# Patient Record
Sex: Female | Born: 1990 | Hispanic: No | Marital: Married | Smoking: Never smoker
Health system: Southern US, Community
[De-identification: ages and names within clinical notes are randomized; demographics above are authoritative.]

## PROBLEM LIST (undated history)

## (undated) DIAGNOSIS — J329 Chronic sinusitis, unspecified: Secondary | ICD-10-CM

## (undated) DIAGNOSIS — J358 Other chronic diseases of tonsils and adenoids: Secondary | ICD-10-CM

## (undated) DIAGNOSIS — N83209 Unspecified ovarian cyst, unspecified side: Secondary | ICD-10-CM

## (undated) DIAGNOSIS — Z8619 Personal history of other infectious and parasitic diseases: Secondary | ICD-10-CM

## (undated) DIAGNOSIS — T148XXA Other injury of unspecified body region, initial encounter: Secondary | ICD-10-CM

## (undated) HISTORY — DX: Unspecified ovarian cyst, unspecified side: N83.209

## (undated) HISTORY — DX: Other chronic diseases of tonsils and adenoids: J35.8

## (undated) HISTORY — DX: Chronic sinusitis, unspecified: J32.9

## (undated) HISTORY — DX: Other injury of unspecified body region, initial encounter: T14.8XXA

## (undated) HISTORY — DX: Personal history of other infectious and parasitic diseases: Z86.19

## (undated) HISTORY — PX: MOLE REMOVAL: SHX2046

## (undated) HISTORY — PX: WISDOM TOOTH EXTRACTION: SHX21

---
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
## 2011-06-01 DIAGNOSIS — T148XXA Other injury of unspecified body region, initial encounter: Secondary | ICD-10-CM

## 2011-06-01 HISTORY — DX: Other injury of unspecified body region, initial encounter: T14.8XXA

## 2011-10-13 ENCOUNTER — Encounter: Payer: Self-pay | Admitting: Women's Health

## 2011-10-27 ENCOUNTER — Encounter: Payer: Self-pay | Admitting: Women's Health

## 2011-12-26 ENCOUNTER — Emergency Department: Payer: Self-pay | Admitting: Emergency Medicine

## 2011-12-26 DIAGNOSIS — N83209 Unspecified ovarian cyst, unspecified side: Secondary | ICD-10-CM

## 2011-12-26 DIAGNOSIS — J329 Chronic sinusitis, unspecified: Secondary | ICD-10-CM

## 2011-12-26 HISTORY — DX: Unspecified ovarian cyst, unspecified side: N83.209

## 2011-12-26 HISTORY — DX: Chronic sinusitis, unspecified: J32.9

## 2011-12-26 LAB — BASIC METABOLIC PANEL
Anion Gap: 10 (ref 7–16)
BUN: 7 mg/dL (ref 7–18)
Calcium, Total: 9.1 mg/dL (ref 9.0–10.7)
Chloride: 104 mmol/L (ref 98–107)
Co2: 28 mmol/L (ref 21–32)
Creatinine: 0.7 mg/dL (ref 0.60–1.30)
EGFR (African American): >60
EGFR (Non-African Amer.): >60
Glucose: 91 mg/dL (ref 65–99)
Osmolality: 281 (ref 275–301)
Potassium: 3.7 mmol/L (ref 3.5–5.1)
Sodium: 142 mmol/L (ref 136–145)

## 2011-12-26 LAB — HCG, QUANTITATIVE, PREGNANCY: Beta Hcg, Quant.: <1 m[IU]/mL — ABNORMAL LOW

## 2011-12-26 LAB — WET PREP, GENITAL

## 2011-12-31 ENCOUNTER — Ambulatory Visit (INDEPENDENT_AMBULATORY_CARE_PROVIDER_SITE_OTHER): Payer: 59 | Admitting: Women's Health

## 2011-12-31 ENCOUNTER — Encounter: Payer: Self-pay | Admitting: Women's Health

## 2011-12-31 VITALS — BP 118/72 | Ht 64.5 in | Wt 186.0 lb

## 2011-12-31 DIAGNOSIS — IMO0001 Reserved for inherently not codable concepts without codable children: Secondary | ICD-10-CM

## 2011-12-31 DIAGNOSIS — E079 Disorder of thyroid, unspecified: Secondary | ICD-10-CM

## 2011-12-31 DIAGNOSIS — Z309 Encounter for contraceptive management, unspecified: Secondary | ICD-10-CM

## 2011-12-31 DIAGNOSIS — N83202 Unspecified ovarian cyst, left side: Secondary | ICD-10-CM

## 2011-12-31 DIAGNOSIS — Z01419 Encounter for gynecological examination (general) (routine) without abnormal findings: Secondary | ICD-10-CM

## 2011-12-31 DIAGNOSIS — N83209 Unspecified ovarian cyst, unspecified side: Secondary | ICD-10-CM

## 2011-12-31 MED ORDER — NORGESTIM-ETH ESTRAD TRIPHASIC 0.18/0.215/0.25 MG-35 MCG PO TABS: 1.0000 | ORAL_TABLET | Freq: Every day | ORAL | Status: DC

## 2011-12-31 NOTE — Progress Notes (Signed)
Michele Burnett [birth year] [id number]    History:    The patient presents for annual exam.  Monthly 5 days cycle using condoms. Had been on Ortho Tri-Cyclen without a problem stopped about 4 months ago due to a change in her insurance. Was seen at her primary care on January 25 due to abdominal pain. Had a CT scan ruled out appendicitis but did show a left ovarian cyst. Had a negative STD screen. Was treated for BV. Complete gardasil series in 09.  Past medical history, past surgical history, family history and social history were all reviewed and documented in the EPIC chart.   ROS:  A  ROS was performed and pertinent positives and negatives are included in the history.  Exam:  Filed Vitals:   12/31/11 0934  BP: 118/72    General appearance:  Normal Head/Neck:  Normal, without cervical or supraclavicular adenopathy. Thyroid:  Symmetrical, normal in size, without palpable masses or nodularity. Respiratory  Effort:  Normal  Auscultation:  Clear without wheezing or rhonchi Cardiovascular  Auscultation:  Regular rate, without rubs, murmurs or gallops  Edema/varicosities:  Not grossly evident Abdominal  Soft,nontender, without masses, guarding or rebound.  Liver/spleen:  No organomegaly noted  Hernia:  None appreciated  Skin  Inspection:  Grossly normal  Palpation:  Grossly normal Neurologic/psychiatric  Orientation:  Normal with appropriate conversation.  Mood/affect:  Normal  Genitourinary    Breasts: Examined lying and sitting.     Right: Without masses, retractions, discharge or axillary adenopathy.     Left: Without masses, retractions, discharge or axillary adenopathy.   Inguinal/mons:  Normal without inguinal adenopathy  External genitalia:  Normal  BUS/Urethra/Skene's glands:  Normal  Bladder:  Normal  Vagina:  Normal  Cervix:  Normal  Uterus:  normal in size, shape and contour.  Midline and mobile  Adnexa/parametria:     Rt: Without masses or  tenderness.   Lt: Without masses or tenderness.  Anus and perineum: Normal  Digital rectal exam:   Assessment/Plan:  21 y.o. SWF G0 for annual exam.  Ruptured left ovarian cyst 12/26/11 Contraception counseling  30 pound weight gain in twenty years  Plan: TSH, declined need for HIV, hepatitis, RPR. Contraception reviewed. Ortho Tri-Cyclen prescription, proper use, start up instructions, condoms first month and for infection control encouraged. Return to office after March cycle we'll repeat ultrasound to check resolution of cyst. SBEs, exercise, calcium rich diet , MVI daily encouraged. Reviewed importance of healthier lifestyle for weight loss and weight maintenance. Dating and driving safety reviewed.    Harrington Challenger [facility], 10:21 AM 12/31/2011

## 2012-01-08 ENCOUNTER — Encounter: Payer: Self-pay | Admitting: Women's Health

## 2012-02-04 ENCOUNTER — Telehealth: Payer: Self-pay | Admitting: *Deleted

## 2012-02-04 MED ORDER — IBUPROFEN 600 MG PO TABS: 600.0000 mg | ORAL_TABLET | Freq: Three times a day (TID) | ORAL | Status: AC | PRN

## 2012-02-04 NOTE — Telephone Encounter (Signed)
(  follow up 12/31/11 OV) Pt mother Michele Burnett called because Michele Burnett was in school. She said that pt is having pelvic pain like before, cramping. I asked mother which side the pain was on and she was unsure. Michele Burnett asked if pt could have something to relieve pain? Please advise

## 2012-02-04 NOTE — Telephone Encounter (Signed)
Please call in Motrin [phone] every 8 hours prn #[phone] no refills. Have her call if no relief.

## 2012-02-04 NOTE — Telephone Encounter (Signed)
Pt mother informed with the below note. rx sent to pharmacy.

## 2012-02-12 ENCOUNTER — Ambulatory Visit (INDEPENDENT_AMBULATORY_CARE_PROVIDER_SITE_OTHER): Payer: 59 | Admitting: Women's Health

## 2012-02-12 ENCOUNTER — Encounter: Payer: Self-pay | Admitting: Women's Health

## 2012-02-12 DIAGNOSIS — N946 Dysmenorrhea, unspecified: Secondary | ICD-10-CM

## 2012-02-12 NOTE — Progress Notes (Signed)
Patient ID: Michele Burnett, female   DOB: Mar 05, 1991, 21 y.o.   MRN: [id number] Presents with the complaint of passing unusual clots with menses. Cycle was at the normal time on Ortho Tri-Cyclen. Same partner for one year with negative cultures. Denies any bleeding between cycles. States abdominal pain is less than it had been. History of an ovarian cyst.  Exam: Abdomen soft with no rebound or radiation of pain. External genitalia within normal limits, speculum exam scant menses type blood with no clots noted. Bimanual uterus was small with no adnexal fullness or tenderness.  Dysmenorrhea  Plan: Reassured normality of occasional blood clots with cycle. Motrin when necessary with cycle, continuing Ortho Tri-Cyclen daily. Patient brought blood clot in ziplock bag, reviewed it appeared to be a normal menses type blood clot.

## 2012-08-05 ENCOUNTER — Telehealth: Payer: Self-pay | Admitting: *Deleted

## 2012-08-05 MED ORDER — NORGESTIM-ETH ESTRAD TRIPHASIC 0.18/0.215/0.25 MG-35 MCG PO TABS: 1.0000 | ORAL_TABLET | Freq: Every day | ORAL | Status: DC

## 2012-08-05 NOTE — Telephone Encounter (Signed)
Pt called requesting refill on birth control, pt has new pharmacy. rx sent.

## 2012-12-22 ENCOUNTER — Emergency Department: Payer: Self-pay | Admitting: Emergency Medicine

## 2013-01-03 ENCOUNTER — Ambulatory Visit (INDEPENDENT_AMBULATORY_CARE_PROVIDER_SITE_OTHER): Payer: 59 | Admitting: Women's Health

## 2013-01-03 ENCOUNTER — Encounter: Payer: Self-pay | Admitting: Women's Health

## 2013-01-03 VITALS — BP 118/76 | Ht 64.0 in | Wt 190.0 lb

## 2013-01-03 DIAGNOSIS — Z01419 Encounter for gynecological examination (general) (routine) without abnormal findings: Secondary | ICD-10-CM

## 2013-01-03 DIAGNOSIS — Z309 Encounter for contraceptive management, unspecified: Secondary | ICD-10-CM

## 2013-01-03 DIAGNOSIS — A64 Unspecified sexually transmitted disease: Secondary | ICD-10-CM

## 2013-01-03 DIAGNOSIS — IMO0001 Reserved for inherently not codable concepts without codable children: Secondary | ICD-10-CM

## 2013-01-03 LAB — CBC WITH DIFFERENTIAL/PLATELET
Hemoglobin: 12.4 g/dL (ref 12.0–15.0)
Lymphocytes Relative: 27 % (ref 12–46)
Lymphs Abs: 2.6 10*3/uL (ref 0.7–4.0)
MCH: 27.9 pg (ref 26.0–34.0)
Monocytes Relative: 6 % (ref 3–12)
Neutro Abs: 6.2 10*3/uL (ref 1.7–7.7)
Neutrophils Relative %: 64 % (ref 43–77)
Platelets: 360 10*3/uL (ref 150–400)
RBC: 4.44 MIL/uL (ref 3.87–5.11)
WBC: 9.7 10*3/uL (ref 4.0–10.5)

## 2013-01-03 MED ORDER — NORGESTIM-ETH ESTRAD TRIPHASIC 0.18/0.215/0.25 MG-35 MCG PO TABS: 1.0000 | ORAL_TABLET | Freq: Every day | ORAL | Status: DC

## 2013-01-03 NOTE — Progress Notes (Signed)
Michele Burnett 1991-06-12 [id number]    History:    The patient presents for annual exam.  Regular monthly cycle on Ortho Tri-Cyclen without complaint. Gardasil series completed 2009. History of Chlamydia in 2011. Same partner for 2 years. 30 pound weight gain in 5 years relates to diet and exercise.  Past medical history, past surgical history, family history and social history were all reviewed and documented in the EPIC chart. 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   ROS:  A  ROS was performed and pertinent positives and negatives are included in the history.  Exam:  Filed Vitals:   01/03/13 1110  BP: 118/76    General appearance:  Normal Head/Neck:  Normal, without cervical or supraclavicular adenopathy. Thyroid:  Symmetrical, normal in size, without palpable masses or nodularity. Respiratory  Effort:  Normal  Auscultation:  Clear without wheezing or rhonchi Cardiovascular  Auscultation:  Regular rate, without rubs, murmurs or gallops  Edema/varicosities:  Not grossly evident Abdominal  Soft,nontender, without masses, guarding or rebound.  Liver/spleen:  No organomegaly noted  Hernia:  None appreciated  Skin  Inspection:  Grossly normal  Palpation:  Grossly normal Neurologic/psychiatric  Orientation:  Normal with appropriate conversation.  Mood/affect:  Normal  Genitourinary    Breasts: Examined lying and sitting.     Right: Without masses, retractions, discharge or axillary adenopathy.     Left: Without masses, retractions, discharge or axillary adenopathy.   Inguinal/mons:  Normal without inguinal adenopathy  External genitalia:  Normal  BUS/Urethra/Skene's glands:  Normal  Bladder:  Normal  Vagina:  Normal  Cervix:  Normal  Uterus:  normal in size, shape and contour.  Midline and mobile  Adnexa/parametria:     Rt: Without masses or tenderness.   Lt: Without masses or tenderness.  Anus and perineum: Normal   Assessment/Plan:  22  y.o. S. WF G0 for annual exam with no complaints.  Normal GYN exam on Ortho Tri-Cyclen Obesity  Plan: Reviewed importance of decreasing calories and increasing exercise for weight loss for health. SBE's, MVI daily, calcium rich diet, Weight Watchers diet encouraged. CBC, UA, Pap screening guidelines reviewed.     Harrington Challenger [facility], 11:36 AM 01/03/2013

## 2013-01-03 NOTE — Patient Instructions (Addendum)
Health Maintenance, 18- to 21-Year-Old SCHOOL PERFORMANCE After high school completion, the Michele Burnett adult may be attending college, technical or vocational school, or entering the military or the work force. SOCIAL AND EMOTIONAL DEVELOPMENT The Michele Burnett adult establishes adult relationships and explores sexual identity. Michele Burnett adults may be living at home or in a college dorm or apartment. Increasing independence is important with Michele Burnett adults. Throughout adolescence, teens should assume responsibility of their own health care. IMMUNIZATIONS Most Michele Burnett adults should be fully vaccinated. A booster dose of Tdap (tetanus, diphtheria, and pertussis, or "whooping cough"), a dose of meningococcal vaccine to protect against a certain type of bacterial meningitis, hepatitis A, human papillomarvirus (HPV), chickenpox, or measles vaccines may be indicated, if not given at an earlier age. Annual influenza or "flu" vaccination should be considered during flu season.   TESTING Annual screening for vision and hearing problems is recommended. Vision should be screened objectively at least once between 18 and 21 years of age. The Michele Burnett adult may be screened for anemia or tuberculosis. Michele Burnett adults should have a blood test to check for high cholesterol during this time period. Michele Burnett adults should be screened for use of alcohol and drugs. If the Michele Burnett adult is sexually active, screening for sexually transmitted infections, pregnancy, or HIV may be performed. Screening for cervical cancer should be performed within 3 years of beginning sexual activity. NUTRITION AND ORAL HEALTH  Adequate calcium intake is important. Consume 3 servings of low-fat milk and dairy products daily. For those who do not drink milk or consume dairy products, calcium enriched foods, such as juice, bread, or cereal, dark, leafy greens, or canned fish are alternate sources of calcium.   Drink plenty of water. Limit fruit juice to 8 to 12 ounces per day.  Avoid sugary beverages or sodas.   Discourage skipping meals, especially breakfast. Teens should eat a good variety of vegetables and fruits, as well as lean meats.   Avoid high fat, high salt, and high sugar foods, such as candy, chips, and cookies.   Encourage Michele Burnett adults to participate in meal planning and preparation.   Eat meals together as a family whenever possible. Encourage conversation at mealtime.   Limit fast food choices and eating out at restaurants.   Brush teeth twice a day and floss.   Schedule dental exams twice a year.  SLEEP Regular sleep habits are important. PHYSICAL, SOCIAL, AND EMOTIONAL DEVELOPMENT  One hour of regular physical activity daily is recommended. Continue to participate in sports.   Encourage Michele Burnett adults to develop their own interests and consider community service or volunteerism.   Provide guidance to the Michele Burnett adult in making decisions about college and work plans.   Make sure that Michele Burnett adults know that they should never be in a situation that makes them uncomfortable, and they should tell partners if they do not want to engage in sexual activity.   Talk to the Michele Burnett adult about body image. Eating disorders may be noted at this time. Michele Burnett adults may also be concerned about being overweight. Monitor the Michele Burnett adult for weight gain or loss.   Mood disturbances, depression, anxiety, alcoholism, or attention problems may be noted in Michele Burnett adults. Talk to the caregiver if there are concerns about mental illness.   Negotiate limit setting and independent decision making.   Encourage the Michele Burnett adult to handle conflict without physical violence.   Avoid loud noises which may impair hearing.   Limit television and computer time to 2 hours per   day. Individuals who engage in excessive sedentary activity are more likely to become overweight.  RISK BEHAVIORS  Sexually active Michele Burnett adults need to take precautions against pregnancy and sexually  transmitted infections. Talk to Michele Burnett adults about contraception.   Provide a tobacco-free and drug-free environment for the Michele Burnett adult. Talk to the Michele Burnett adult about drug, tobacco, and alcohol use among friends or at friends' homes. Make sure the Michele Burnett adult knows that smoking tobacco or marijuana and taking drugs have health consequences and may impact brain development.   Teach the Michele Burnett adult about appropriate use of over-the-counter or prescription medicines.   Establish guidelines for driving and for riding with friends.   Talk to Michele Burnett adults about the risks of drinking and driving or boating. Encourage the Cheria Burnett adult to call you if he or she or friends have been drinking or using drugs.   Remind Michele Burnett adults to wear seat belts at all times in cars and life vests in boats.   Michele Burnett adults should always wear a properly fitted helmet when they are riding a bicycle.   Use caution with all-terrain vehicles (ATVs) or other motorized vehicles.   Do not keep handguns in the home. (If you do, the gun and ammunition should be locked separately and out of the Michele Burnett adult's access.)   Equip your home with smoke detectors and change the batteries regularly. Make sure all family members know the fire escape plans for your home.   Teach Michele Burnett adults not to swim alone and not to dive in shallow water.   All individuals should wear sunscreen that protects against UVA and UVB light with at least a sun protection factor (SPF) of 30 when out in the sun. This minimizes sun burning.  WHAT'S NEXT? Michele Burnett adults should visit their pediatrician or family physician yearly. By Michele Burnett adulthood, health care should be transitioned to a family physician or internal medicine specialist. Sexually active females may want to begin annual physical exams with a gynecologist. Document Released: 02/12/2007 Document Revised: 02/09/2012 Document Reviewed: 03/04/2007 Exit[employer] Patient Information 2013 [employer].    

## 2013-01-04 LAB — URINALYSIS W MICROSCOPIC + REFLEX CULTURE
Casts: NONE SEEN
Glucose, UA: NEGATIVE mg/dL
Hgb urine dipstick: NEGATIVE
Leukocytes, UA: NEGATIVE
Nitrite: NEGATIVE
Specific Gravity, Urine: 1.014 (ref 1.005–1.030)
pH: 8.5 — ABNORMAL HIGH (ref 5.0–8.0)

## 2013-01-06 LAB — URINE CULTURE: Colony Count: >=100000

## 2013-01-07 ENCOUNTER — Other Ambulatory Visit: Payer: Self-pay | Admitting: Women's Health

## 2013-01-07 MED ORDER — SULFAMETHOXAZOLE-TRIMETHOPRIM 800-160 MG PO TABS: 1.0000 | ORAL_TABLET | Freq: Two times a day (BID) | ORAL | Status: DC

## 2013-01-15 ENCOUNTER — Other Ambulatory Visit: Payer: Self-pay

## 2013-06-07 ENCOUNTER — Ambulatory Visit: Payer: Self-pay | Admitting: Gynecology

## 2013-10-06 ENCOUNTER — Other Ambulatory Visit: Payer: Self-pay

## 2013-11-17 ENCOUNTER — Encounter: Payer: Self-pay | Admitting: General Practice

## 2013-12-01 ENCOUNTER — Encounter: Payer: Self-pay | Admitting: General Practice

## 2014-01-04 ENCOUNTER — Ambulatory Visit (INDEPENDENT_AMBULATORY_CARE_PROVIDER_SITE_OTHER): Payer: 59 | Admitting: Women's Health

## 2014-01-04 ENCOUNTER — Other Ambulatory Visit (HOSPITAL_COMMUNITY)
Admission: RE | Admit: 2014-01-04 | Discharge: 2014-01-04 | Disposition: A | Discharge status: Home / Self Care | Payer: 59 | Source: Ambulatory Visit | Attending: Gynecology | Admitting: Gynecology

## 2014-01-04 ENCOUNTER — Encounter: Payer: Self-pay | Admitting: Women's Health

## 2014-01-04 VITALS — BP 111/75 | Ht 64.0 in | Wt 197.6 lb

## 2014-01-04 DIAGNOSIS — Z833 Family history of diabetes mellitus: Secondary | ICD-10-CM

## 2014-01-04 DIAGNOSIS — A499 Bacterial infection, unspecified: Secondary | ICD-10-CM

## 2014-01-04 DIAGNOSIS — B9689 Other specified bacterial agents as the cause of diseases classified elsewhere: Secondary | ICD-10-CM

## 2014-01-04 DIAGNOSIS — E079 Disorder of thyroid, unspecified: Secondary | ICD-10-CM

## 2014-01-04 DIAGNOSIS — IMO0001 Reserved for inherently not codable concepts without codable children: Secondary | ICD-10-CM

## 2014-01-04 DIAGNOSIS — Z309 Encounter for contraceptive management, unspecified: Secondary | ICD-10-CM

## 2014-01-04 DIAGNOSIS — N760 Acute vaginitis: Secondary | ICD-10-CM

## 2014-01-04 DIAGNOSIS — Z01419 Encounter for gynecological examination (general) (routine) without abnormal findings: Secondary | ICD-10-CM | POA: Insufficient documentation

## 2014-01-04 LAB — CBC WITH DIFFERENTIAL/PLATELET
BASOS ABS: 0 10*3/uL (ref 0.0–0.1)
BASOS PCT: 0 % (ref 0–1)
EOS PCT: 2 % (ref 0–5)
Eosinophils Absolute: 0.1 10*3/uL (ref 0.0–0.7)
HCT: 38 % (ref 36.0–46.0)
Hemoglobin: 13 g/dL (ref 12.0–15.0)
LYMPHS PCT: 35 % (ref 12–46)
Lymphs Abs: 3.1 10*3/uL (ref 0.7–4.0)
MCH: 28.8 pg (ref 26.0–34.0)
MCHC: 34.2 g/dL (ref 30.0–36.0)
MCV: 84.1 fL (ref 78.0–100.0)
Monocytes Absolute: 0.6 10*3/uL (ref 0.1–1.0)
Monocytes Relative: 7 % (ref 3–12)
Neutro Abs: 5 10*3/uL (ref 1.7–7.7)
Neutrophils Relative %: 56 % (ref 43–77)
PLATELETS: 305 10*3/uL (ref 150–400)
RBC: 4.52 MIL/uL (ref 3.87–5.11)
RDW: 12.8 % (ref 11.5–15.5)
WBC: 8.8 10*3/uL (ref 4.0–10.5)

## 2014-01-04 LAB — TSH: TSH: 1.765 u[IU]/mL (ref 0.350–4.500)

## 2014-01-04 LAB — WET PREP FOR TRICH, YEAST, CLUE
Trich, Wet Prep: NONE SEEN
Yeast Wet Prep HPF POC: NONE SEEN

## 2014-01-04 LAB — GLUCOSE, RANDOM: Glucose, Bld: 79 mg/dL (ref 70–99)

## 2014-01-04 MED ORDER — NORGESTIM-ETH ESTRAD TRIPHASIC 0.18/0.215/0.25 MG-35 MCG PO TABS: 1.0000 | ORAL_TABLET | Freq: Every day | ORAL | Status: DC

## 2014-01-04 MED ORDER — METRONIDAZOLE 0.75 % VA GEL: VAGINAL | Status: DC

## 2014-01-04 NOTE — Patient Instructions (Signed)
Health Maintenance, 44- to 22-Year-Old SCHOOL PERFORMANCE After high school completion, the Delvina Mizzell adult may be attending college, Hotel manager or vocational school, or entering the [employer] or the work force. SOCIAL AND EMOTIONAL DEVELOPMENT The Yuritzi Kamp adult establishes adult relationships and explores sexual identity. Aigner Horseman adults may be living at home or in a college dorm or apartment. Increasing independence is important with Kanyla Omeara adults. Throughout these years, Jasim Harari adults should assume responsibility of their own health care. RECOMMENDED IMMUNIZATIONS  Influenza vaccine.  All adults should be immunized every year.  All adults, including pregnant women and people with hives-only allergy to eggs can receive the inactivated influenza (IIV) vaccine.  Adults aged 22 49 years can receive the recombinant influenza (RIV) vaccine. The RIV vaccine does not contain any egg protein.  Tetanus, diphtheria, and acellular pertussis (Td, Tdap) vaccine.  Pregnant women should receive 1 dose of Tdap vaccine during each pregnancy. The dose should be obtained regardless of the length of time since the last dose. Immunization is preferred during the 27th to 36th week of gestation.  An adult who has not previously received Tdap or who does not know his or her vaccine status should receive 1 dose of Tdap. This initial dose should be followed by tetanus and diphtheria toxoids (Td) booster doses every 10 years.  Adults with an unknown or incomplete history of completing a 3-dose immunization series with Td-containing vaccines should begin or complete a primary immunization series including a Tdap dose.  Adults should receive a Td booster every 10 years.  Varicella vaccine.  An adult without evidence of immunity to varicella should receive 2 doses or a second dose if he or she has previously received 1 dose.  Pregnant females who do not have evidence of immunity should receive the first dose after pregnancy.  This first dose should be obtained before leaving the health care facility. The second dose should be obtained 4 8 weeks after the first dose.  Human papillomavirus (HPV) vaccine.  Females aged 15 22 years who have not received the vaccine previously should obtain the 3-dose series.  The vaccine is not recommended for use in pregnant females. However, pregnancy testing is not needed before receiving a dose. If a female is found to be pregnant after receiving a dose, no treatment is needed. In that case, the remaining doses should be delayed until after the pregnancy.  Males aged 12 22 years who have not received the vaccine previously should receive the 3-dose series. Males aged 39 22 years may be immunized.  Immunization is recommended through the age of 1 years for any female who has sex with males and did not get any or all doses earlier.  Immunization is recommended for any person with an immunocompromised condition through the age of 27 years if he or she did not get any or all doses earlier.  During the 3-dose series, the second dose should be obtained 4 8 weeks after the first dose. The third dose should be obtained 24 weeks after the first dose and 16 weeks after the second dose.  Measles, mumps, and rubella (MMR) vaccine.  Adults born in [birth year] or later should have 1 or more doses of MMR vaccine unless there is a contraindication to the vaccine or there is laboratory evidence of immunity to each of the three diseases.  A routine second dose of MMR vaccine should be obtained at least 28 days after the first dose for students attending postsecondary schools, health care workers, or international travelers.  For females of childbearing age, rubella immunity should be determined. If there is no evidence of immunity, females who are not pregnant should be vaccinated. If there is no evidence of immunity, females who are pregnant should delay immunization until after pregnancy.  Pneumococcal  13-valent conjugate (PCV13) vaccine.  When indicated, a person who is uncertain of his or her immunization history and has no record of immunization should receive the PCV13 vaccine.  An adult aged 22 years or older who has certain medical conditions and has not been previously immunized should receive 1 dose of PCV13 vaccine. This PCV13 should be followed with a dose of pneumococcal polysaccharide (PPSV23) vaccine. The PPSV23 vaccine dose should be obtained at least 8 weeks after the dose of PCV13 vaccine.  An adult aged 22 years or older who has certain medical conditions and previously received 1 or more doses of PPSV23 vaccine should receive 1 dose of PCV13. The PCV13 vaccine dose should be obtained 1 or more years after the last PPSV23 vaccine dose.  Pneumococcal polysaccharide (PPSV23) vaccine.  When PCV13 is also indicated, PCV13 should be obtained first.  An adult younger than age 65 years who has certain medical conditions should be immunized.  Any person who resides in a nursing home or long-term care facility should be immunized.  An adult smoker should be immunized.  People with an immunocompromised condition and certain other conditions should receive both PCV13 and PPSV23 vaccines.  People with human immunodeficiency virus (HIV) infection should be immunized as soon as possible after diagnosis.  Immunization during chemotherapy or radiation therapy should be avoided.  Routine use of PPSV23 vaccine is not recommended for American Indians, [place] Natives, or people younger than 65 years unless there are medical conditions that require PPSV23 vaccine.  When indicated, people who have unknown immunization and have no record of immunization should receive PPSV23 vaccine.  One-time revaccination 5 years after the first dose of PPSV23 is recommended for people aged 19 64 years who have chronic kidney failure, nephrotic syndrome, asplenia, or immunocompromised  conditions.  Meningococcal vaccine.  Adults with asplenia or persistent complement component deficiencies should receive 2 doses of quadrivalent meningococcal conjugate (MenACWY-D) vaccine. The doses should be obtained at least 2 months apart.  Microbiologists working with certain meningococcal bacteria, military recruits, people at risk during an outbreak, and people who travel to or live in countries with a high rate of meningitis should be immunized.  A first-year college student up through age 21 years who is living in a residence hall should receive a dose if he or she did not receive a dose on or after his or her 16th birthday.  Adults who have certain high-risk conditions should receive one or more doses of vaccine.  Hepatitis A vaccine.  Adults who wish to be protected from this disease, have certain high-risk conditions, work with hepatitis A-infected animals, work in hepatitis A research labs, or travel to or work in countries with a high rate of hepatitis A should be immunized.  Adults who were previously unvaccinated and who anticipate close contact with an international adoptee during the first 60 days after arrival in the [place] from a country with a high rate of hepatitis A should be immunized.  Hepatitis B vaccine.  Adults who wish to be protected from this disease, have certain high-risk conditions, may be exposed to blood or other infectious body fluids, are household contacts or sex partners of hepatitis B positive people, are clients or workers in   certain care facilities, or travel to or work in countries with a high rate of hepatitis B should be immunized.  Haemophilus influenzae type b (Hib) vaccine.  A previously unvaccinated person with asplenia or sickle cell disease or having a scheduled splenectomy should receive 1 dose of Hib vaccine.  Regardless of previous immunization, a recipient of a hematopoietic stem cell transplant should receive a 3-dose series 6  12 months after his or her successful transplant.  Hib vaccine is not recommended for adults with HIV infection. TESTING Annual screening for vision and hearing problems is recommended. Vision should be screened objectively at least once between 18 21 years of age. The Particia Strahm adult may be screened for anemia or tuberculosis. Damia Bobrowski adults should have a blood test to check for high cholesterol during this time period. Dayan Kreis adults should be screened for use of alcohol and drugs. If the Maclane Holloran adult is sexually active, screening for sexually transmitted infections, pregnancy, or HIV may be performed.  NUTRITION AND ORAL HEALTH  Adequate calcium intake is important. Consume 3 servings of low-fat milk and dairy products daily. For those who do not drink milk or consume dairy products, calcium enriched foods, such as juice, bread, or cereal, dark, leafy greens, or canned fish are alternate sources of calcium.  Drink plenty of water. Limit fruit juice to 8 12 ounces (240 360 mL) each day. Avoid sugary beverages or sodas.  Discourage skipping meals, especially breakfast. Adelaida Reindel adults should eat a good variety of vegetables and fruits, as well as lean meats.  Avoid foods high in fat, salt, or sugar, such as candy, chips, and cookies.  Encourage Ameisha Mcclellan adults to participate in meal planning and preparation.  Eat meals together as a family whenever possible. Encourage conversation at mealtime.  Limit fast food choices and eating out at restaurants.  Brush teeth twice a day and floss.  Schedule dental exams twice a year. SLEEP Regular sleep habits are important. PHYSICAL, SOCIAL, AND EMOTIONAL DEVELOPMENT  One hour of regular physical activity daily is recommended. Continue to participate in sports.  Encourage Nikki Glanzer adults to develop their own interests and consider community service or volunteerism.  Provide guidance to the Joey Hudock adult in making decisions about college and work plans.  Make sure  that Jartavious Mckimmy adults know that they should never be in a situation that makes them uncomfortable, and they should tell partners if they do not want to engage in sexual activity.  Talk to the Tashauna Caisse adult about body image. Eating disorders may be noted at this time. Jaymari Cromie adults may also be concerned about being overweight. Monitor the Charice Zuno adult for weight gain or loss.  Mood disturbances, depression, anxiety, alcoholism, or attention problems may be noted in Marley Charlot adults. Talk to the caregiver if there are concerns about mental illness.  Negotiate limit setting and independent decision making.  Encourage the Yenty Bloch adult to handle conflict without physical violence.  Avoid loud noises which may impair hearing.  Limit television and computer time to 2 hours each day. Individuals who engage in excessive sedentary activity are more likely to become overweight. RISK BEHAVIORS  Sexually active Evelena Masci adults need to take precautions against pregnancy and sexually transmitted infections. Talk to Stehanie Ekstrom adults about contraception.  Provide a tobacco-free and drug-free environment for the Nickie Warwick adult. Talk to the Jaque Dacy adult about drug, tobacco, and alcohol use among friends or at friend's homes. Make sure the Ryelan Kazee adult knows that smoking tobacco or marijuana and taking drugs have health consequences and   may impact brain development.  Teach the Momoko Slezak adult about appropriate use of over-the-counter or prescription medicines.  Establish guidelines for driving and for riding with friends.  Talk to Carleah Yablonski adults about the risks of drinking and driving or boating. Encourage the Vineeth Fell adult to call you if he or she or friends have been drinking or using drugs.  Remind Consandra Laske adults to wear seat belts at all times in cars and life vests in boats.  Shamon Lobo adults should always wear a properly fitted helmet when they are riding a bicycle.  Use caution with all-terrain vehicles (ATVs) or other motorized  vehicles.  Do not keep handguns in the home. (If you do, the gun and ammunition should be locked separately and out of the Kentavious Michele adult's access.)  Equip your home with smoke detectors and change the batteries regularly. Make sure all family members know the fire escape plans for your home.  Teach Owen Pagnotta adults not to swim alone and not to dive in shallow water.  All individuals should wear sunscreen when out in the sun. This minimizes sunburning. WHAT'S NEXT? Jawanda Passey adults should visit their pediatrician or family physician yearly. By Vernestine Brodhead adulthood, health care should be transitioned to a family physician or internal medicine specialist. Sexually active females may want to begin annual physical exams with a gynecologist. Document Released: 02/12/2007 Document Revised: 03/14/2013 Document Reviewed: 03/04/2007 Exit[employer] Patient Information 2014 [employer].  

## 2014-01-04 NOTE — Progress Notes (Signed)
Michele MccoyKala N Burnett 02/29/1991 [id number]    History:    Presents for annual exam.  Regular cycle on Ortho Tri-Cyclen. Gardasil series completed. Same partner. Complained of feeling down/sad, no feelings of harming self.  Past medical history, past surgical history, family history and social history were all reviewed and documented in the EPIC chart. 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  Exam:  Filed Vitals:   01/04/14 1137  BP: 111/75    General appearance:  Normal Thyroid:  Symmetrical, normal in size, without palpable masses or nodularity. Respiratory  Auscultation:  Clear without wheezing or rhonchi Cardiovascular  Auscultation:  Regular rate, without rubs, murmurs or gallops  Edema/varicosities:  Not grossly evident Abdominal  Soft,nontender, without masses, guarding or rebound.  Liver/spleen:  No organomegaly noted  Hernia:  None appreciated  Skin  Inspection:  Grossly normal   Breasts: Examined lying and sitting.     Right: Without masses, retractions, discharge or axillary adenopathy.     Left: Without masses, retractions, discharge or axillary adenopathy. Gentitourinary   Inguinal/mons:  Normal without inguinal adenopathy  External genitalia:  Normal  BUS/Urethra/Skene's glands:  Normal  Vagina:  Wet adherent discharge, wet prep positive for amines, clues, TNTC bacteria Cervix:  Normal  Uterus:   normal in size, shape and contour.  Midline and mobile  Adnexa/parametria:     Rt: Without masses or tenderness.   Lt: Without masses or tenderness.  Anus and perineum: Normal  Digital rectal exam: Normal sphincter tone without palpated masses or tenderness  Assessment/Plan:  23 y.o. SWF G0 for annual exam.    Bacteria vaginosis Depression symptoms Obesity  Plan: Michele FanningJulie Burnett's name and number was given to seek counseling. Reviewed importance of daily regular exercise for stress relief,  increasing leisure activities. SBE's, decreasing calories for weight loss, Weight Watchers encouraged,  MVI daily encouraged. MetroGel vaginal cream 1 applicator at bedtime x5, alcohol precautions reviewed. Instructed to call if no relief of discharge. Ortho Tri-Cyclen prescription, proper use, risk for blood clots and strokes reviewed. CBC, glucose, UA, Pap. Pap screening reviewed.    Harrington ChallengerYOUNG,Michele Burnett, 1:30 PM 01/04/2014

## 2014-01-05 LAB — URINALYSIS W MICROSCOPIC + REFLEX CULTURE
BACTERIA UA: NONE SEEN
BILIRUBIN URINE: NEGATIVE
CASTS: NONE SEEN
Crystals: NONE SEEN
GLUCOSE, UA: NEGATIVE mg/dL
HGB URINE DIPSTICK: NEGATIVE
KETONES UR: NEGATIVE mg/dL
Nitrite: NEGATIVE
PROTEIN: NEGATIVE mg/dL
Specific Gravity, Urine: 1.009 (ref 1.005–1.030)
Urobilinogen, UA: 0.2 mg/dL (ref 0.0–1.0)
pH: 6.5 (ref 5.0–8.0)

## 2014-01-06 LAB — URINE CULTURE: Colony Count: 8000

## 2014-02-06 ENCOUNTER — Ambulatory Visit: Payer: Self-pay | Admitting: Internal Medicine

## 2014-04-04 ENCOUNTER — Other Ambulatory Visit: Payer: Self-pay | Admitting: Internal Medicine

## 2014-04-04 LAB — HCG, QUANTITATIVE, PREGNANCY

## 2014-04-11 ENCOUNTER — Ambulatory Visit: Payer: Self-pay | Admitting: Internal Medicine

## 2015-01-09 ENCOUNTER — Ambulatory Visit (INDEPENDENT_AMBULATORY_CARE_PROVIDER_SITE_OTHER): Payer: 59 | Admitting: Women's Health

## 2015-01-09 ENCOUNTER — Encounter: Payer: Self-pay | Admitting: Women's Health

## 2015-01-09 VITALS — BP 124/80 | Ht 64.0 in | Wt 204.0 lb

## 2015-01-09 DIAGNOSIS — Z01419 Encounter for gynecological examination (general) (routine) without abnormal findings: Secondary | ICD-10-CM

## 2015-01-09 DIAGNOSIS — Z13220 Encounter for screening for lipoid disorders: Secondary | ICD-10-CM

## 2015-01-09 DIAGNOSIS — Z3040 Encounter for surveillance of contraceptives, unspecified: Secondary | ICD-10-CM

## 2015-01-09 LAB — CBC WITH DIFFERENTIAL/PLATELET
Basophils Absolute: 0 10*3/uL (ref 0.0–0.1)
Basophils Relative: 0 % (ref 0–1)
Eosinophils Absolute: 0.2 10*3/uL (ref 0.0–0.7)
Eosinophils Relative: 2 % (ref 0–5)
HEMATOCRIT: 37.1 % (ref 36.0–46.0)
HEMOGLOBIN: 12.4 g/dL (ref 12.0–15.0)
LYMPHS ABS: 3 10*3/uL (ref 0.7–4.0)
LYMPHS PCT: 38 % (ref 12–46)
MCH: 28.3 pg (ref 26.0–34.0)
MCHC: 33.4 g/dL (ref 30.0–36.0)
MCV: 84.7 fL (ref 78.0–100.0)
MONO ABS: 0.5 10*3/uL (ref 0.1–1.0)
MONOS PCT: 6 % (ref 3–12)
MPV: 9 fL (ref 8.6–12.4)
NEUTROS ABS: 4.3 10*3/uL (ref 1.7–7.7)
NEUTROS PCT: 54 % (ref 43–77)
Platelets: 303 10*3/uL (ref 150–400)
RBC: 4.38 MIL/uL (ref 3.87–5.11)
RDW: 13.1 % (ref 11.5–15.5)
WBC: 7.9 10*3/uL (ref 4.0–10.5)

## 2015-01-09 LAB — URINALYSIS W MICROSCOPIC + REFLEX CULTURE
BACTERIA UA: NONE SEEN
Bilirubin Urine: NEGATIVE
CASTS: NONE SEEN
Crystals: NONE SEEN
Glucose, UA: NEGATIVE mg/dL
HGB URINE DIPSTICK: NEGATIVE
Ketones, ur: NEGATIVE mg/dL
Nitrite: NEGATIVE
PH: 6 (ref 5.0–8.0)
Protein, ur: NEGATIVE mg/dL
Specific Gravity, Urine: 1.022 (ref 1.005–1.030)
UROBILINOGEN UA: 0.2 mg/dL (ref 0.0–1.0)

## 2015-01-09 LAB — LIPID PANEL
CHOLESTEROL: 181 mg/dL (ref 0–200)
HDL: 61 mg/dL (ref >39)
LDL Cholesterol: 92 mg/dL (ref 0–99)
Total CHOL/HDL Ratio: 3 Ratio
Triglycerides: 141 mg/dL (ref <150)
VLDL: 28 mg/dL (ref 0–40)

## 2015-01-09 LAB — GLUCOSE, RANDOM: GLUCOSE: 83 mg/dL (ref 70–99)

## 2015-01-09 MED ORDER — NORGESTIM-ETH ESTRAD TRIPHASIC 0.18/0.215/0.25 MG-35 MCG PO TABS: 1.0000 | ORAL_TABLET | Freq: Every day | ORAL | Status: DC

## 2015-01-09 NOTE — Progress Notes (Signed)
Michele Burnett 12/16/1990 [id number]    History:    Presents for annual exam.  Monthly cycle on Ortho Tri-Cyclen/same partner. Normal Pap history. Gardasil series completed.  Past medical history, past surgical history, family history and social history were all reviewed and documented in the EPIC chart. 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  Recently got a [employer] miniature docs and Michele Burnett. Has had some issues with depression in the past doing well now.  ROS:  A ROS was performed and pertinent positives and negatives are included.  Exam:  Filed Vitals:   01/09/15 0901  BP: 124/80    General appearance:  Normal Thyroid:  Symmetrical, normal in size, without palpable masses or nodularity. Respiratory  Auscultation:  Clear without wheezing or rhonchi Cardiovascular  Auscultation:  Regular rate, without rubs, murmurs or gallops  Edema/varicosities:  Not grossly evident Abdominal  Soft,nontender, without masses, guarding or rebound.  Liver/spleen:  No organomegaly noted  Hernia:  None appreciated  Skin  Inspection:  Grossly normal   Breasts: Examined lying and sitting.     Right: Without masses, retractions, discharge or axillary adenopathy.     Left: Without masses, retractions, discharge or axillary adenopathy. Gentitourinary   Inguinal/mons:  Normal without inguinal adenopathy  External genitalia:  Normal  BUS/Urethra/Skene's glands:  Normal  Vagina:  Normal  Cervix:  Normal  Uterus:   normal in size, shape and contour.  Midline and mobile  Adnexa/parametria:     Rt: Without masses or tenderness.   Lt: Without masses or tenderness.  Anus and perineum: Normal   Assessment/Plan:  24 y.o. SWF G0 for annual exam with no complaints.   Monthly cycle on Ortho Tri-Cyclen Obesity  Plan: Reviewed has gained 7 pounds in past year, Weight Watchers, increasing exercise and decreasing calories encouraged. SBE's, MVI daily encouraged. Ortho Tri-Cyclen prescription,  proper use, slight risk for blood clots and strokes reviewed. CBC, glucose, lipid panel, UA. Pap normal 2015, new screening guidelines reviewed.   Harrington ChallengerYOUNG,Michele J Spectrum Health Blodgett CampusWHNP, 9:38 AM 01/09/2015

## 2015-01-09 NOTE — Patient Instructions (Signed)
Exercise to Stay Healthy Exercise helps you become and stay healthy. EXERCISE IDEAS AND TIPS Choose exercises that:  You enjoy.  Fit into your day. You do not need to exercise really hard to be healthy. You can do exercises at a slow or medium level and stay healthy. You can:  Stretch before and after working out.  Try yoga, Pilates, or tai chi.  Lift weights.  Walk fast, swim, jog, run, climb stairs, bicycle, dance, or rollerskate.  Take aerobic classes. Exercises that burn about 150 calories:  Running 1  miles in 15 minutes.  Playing volleyball for 45 to 60 minutes.  Washing and waxing a car for 45 to 60 minutes.  Playing touch football for 45 minutes.  Walking 1  miles in 35 minutes.  Pushing a stroller 1  miles in 30 minutes.  Playing basketball for 30 minutes.  Raking leaves for 30 minutes.  Bicycling 5 miles in 30 minutes.  Walking 2 miles in 30 minutes.  Dancing for 30 minutes.  Shoveling snow for 15 minutes.  Swimming laps for 20 minutes.  Walking up stairs for 15 minutes.  Bicycling 4 miles in 15 minutes.  Gardening for 30 to 45 minutes.  Jumping rope for 15 minutes.  Washing windows or floors for 45 to 60 minutes. Document Released: 12/20/2010 Document Revised: 02/09/2012 Document Reviewed: 12/20/2010 [facility] Patient Information 2015 [place], [place]. This information is not intended to replace advice given to you by your health care provider. Make sure you discuss any questions you have with your health care provider.

## 2015-01-10 ENCOUNTER — Encounter: Payer: Self-pay | Admitting: Women's Health

## 2015-01-10 LAB — URINE CULTURE
Colony Count: NO GROWTH
Organism ID, Bacteria: NO GROWTH

## 2015-08-16 ENCOUNTER — Encounter: Payer: Self-pay | Admitting: Internal Medicine

## 2015-08-16 ENCOUNTER — Ambulatory Visit (INDEPENDENT_AMBULATORY_CARE_PROVIDER_SITE_OTHER): Payer: Self-pay | Admitting: Internal Medicine

## 2015-08-16 ENCOUNTER — Ambulatory Visit
Admission: RE | Admit: 2015-08-16 | Discharge: 2015-08-16 | Disposition: A | Discharge status: Home / Self Care | Payer: 59 | Source: Ambulatory Visit | Attending: Internal Medicine | Admitting: Internal Medicine

## 2015-08-16 ENCOUNTER — Other Ambulatory Visit: Payer: Self-pay | Admitting: Internal Medicine

## 2015-08-16 VITALS — BP 100/60 | HR 80 | Ht 65.0 in | Wt 209.8 lb

## 2015-08-16 DIAGNOSIS — M545 Low back pain, unspecified: Secondary | ICD-10-CM

## 2015-08-16 DIAGNOSIS — F329 Major depressive disorder, single episode, unspecified: Secondary | ICD-10-CM | POA: Insufficient documentation

## 2015-08-16 DIAGNOSIS — K589 Irritable bowel syndrome without diarrhea: Secondary | ICD-10-CM | POA: Insufficient documentation

## 2015-08-16 DIAGNOSIS — F32A Depression, unspecified: Secondary | ICD-10-CM | POA: Insufficient documentation

## 2015-08-16 LAB — POCT URINE PREGNANCY: Preg Test, Ur: NEGATIVE

## 2015-08-16 MED ORDER — CYCLOBENZAPRINE HCL 10 MG PO TABS: 10.0000 mg | ORAL_TABLET | Freq: Three times a day (TID) | ORAL | Status: DC | PRN

## 2015-08-16 NOTE — Progress Notes (Signed)
Date:  08/16/2015   Name:  Michele Burnett   DOB:  [birth year]   MRN:  [id number]   Chief Complaint: Back Pain Back Pain This is a recurrent problem. The current episode started 1 to 4 weeks ago. The problem occurs constantly. The problem is unchanged. The pain is present in the lumbar spine. The quality of the pain is described as aching. The pain does not radiate. The pain is the same all the time. Pertinent negatives include no leg pain, numbness, paresthesias, tingling or weakness.   patient returns of low back pain that started a couple of months ago. She does not recall any specific injury at that time.  She did injure her back at work in April when to walk-in clinic at current clinic and they prescribed Flexeril. They did not do x-rays. She seemed to recover from that occurrence is not sure why her back is bothering her now.  She denies any leg weakness or numbness. Ice seems to help. Tylenol is of no benefit.   Review of Systems:  Review of Systems  Musculoskeletal: Positive for back pain. Negative for joint swelling and gait problem.  Neurological: Negative for tingling, weakness, numbness and paresthesias.    Patient Active Problem List   Diagnosis Date Noted  . 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 Clinical depression 08/16/2015  . STD (sexually transmitted disease) 08/31/2010    Prior to Admission medications   Medication Sig Start Date End Date Taking? Authorizing Provider  dicyclomine (BENTYL) 10 MG capsule Take 1 capsule by mouth 4 (four) times daily.   Yes Historical Provider, MD  Norgestimate-Ethinyl Estradiol Triphasic (TRINESSA, 28,) 0.18/0.215/0.25 MG-35 MCG tablet Take 1 tablet by mouth daily. 01/09/15 01/14/16 Yes Harrington Challenger, NP    Allergies  Allergen Reactions  . Amoxicillin     Other reaction(s): Vomiting    Past Surgical History  Procedure Laterality Date  . Wisdom tooth extraction      Social History  Substance Use Topics  . Smoking status: Never Smoker   .  Smokeless tobacco: Never Used  . Alcohol Use: No     Medication list has been reviewed and updated.  Physical Examination:  Physical Exam  Constitutional: She is oriented to person, place, and time. She appears well-developed. No distress.  HENT:  Head: Normocephalic and atraumatic.  Eyes: Conjunctivae are normal. Right eye exhibits no discharge. Left eye exhibits no discharge. No scleral icterus.  Pulmonary/Chest: Effort normal. No respiratory distress.  Musculoskeletal: Normal range of motion.       Lumbar back: She exhibits tenderness, bony tenderness and spasm. She exhibits no swelling and no edema.  Neurological: She is alert and oriented to person, place, and time.  Reflex Scores:      Patellar reflexes are 2+ on the right side and 2+ on the left side. Skin: Skin is warm and dry. No rash noted.  Psychiatric: She has a normal mood and affect. Her behavior is normal. Thought content normal.  Nursing note and vitals reviewed.   BP 100/60 mmHg  Pulse 80  Ht 5\' 5"  (1.651 m)  Wt 209 lb 12.8 oz (95.165 kg)  BMI 34.91 kg/m2  Assessment and Plan: 1. Right-sided low back pain without sciatica  continue ice,  Modified activities, change from Tylenol to Aleve twice a day. - DG Lumbar Spine Complete; Future - cyclobenzaprine (FLEXERIL) 10 MG tablet; Take 1 tablet (10 mg total) by mouth 3 (three) times daily as needed for muscle spasms.  Dispense: 30  tablet; Refill: 0 - POCT urine pregnancy   Bari Edward, MD [facility]  08/16/2015

## 2015-09-17 ENCOUNTER — Encounter: Payer: Self-pay | Admitting: Internal Medicine

## 2015-09-19 ENCOUNTER — Encounter: Payer: Self-pay | Admitting: Internal Medicine

## 2015-09-19 ENCOUNTER — Ambulatory Visit (INDEPENDENT_AMBULATORY_CARE_PROVIDER_SITE_OTHER): Payer: Self-pay | Admitting: Internal Medicine

## 2015-09-19 VITALS — BP 110/64 | HR 84 | Ht 65.0 in | Wt 209.6 lb

## 2015-09-19 DIAGNOSIS — S39012D Strain of muscle, fascia and tendon of lower back, subsequent encounter: Secondary | ICD-10-CM

## 2015-09-19 MED ORDER — MELOXICAM 15 MG PO TABS: 15.0000 mg | ORAL_TABLET | Freq: Every day | ORAL | Status: DC

## 2015-09-19 MED ORDER — METAXALONE 800 MG PO TABS: 800.0000 mg | ORAL_TABLET | Freq: Three times a day (TID) | ORAL | Status: DC

## 2015-09-19 NOTE — Progress Notes (Signed)
Date:  09/19/2015   Name:  Michele Burnett   DOB:  03/03/1991   MRN:  [id number]   Chief Complaint: Back Pain Patient was seen a month ago with low back strain. She had been having discomfort on and off and then was seen in urgent care and prescribed Flexeril. She was seen here in follow-up prescribe additional Flexeril Aleve and had lumbar spine films. Lumbar spine films were negative.  She continues to have low back strain that seems to be worsening. It is probably not helped by her work as a Public relations account executivenurse's assistant. She's continued Flexeril twice a day along with Aleve twice a day. She denies loss of bowel or bladder function, weakness in the legs or numbness.  Review of Systems  Constitutional: Negative for fever, chills and fatigue.  Respiratory: Negative for chest tightness and shortness of breath.   Cardiovascular: Negative for chest pain and leg swelling.  Genitourinary: Negative for difficulty urinating.  Musculoskeletal: Positive for back pain. Negative for myalgias and gait problem.  Neurological: Negative for weakness and numbness.    Patient Active Problem List   Diagnosis Date Noted  . 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 Clinical depression 08/16/2015  . STD (sexually transmitted disease) 08/31/2010    Prior to Admission medications   Medication Sig Start Date End Date Taking? Authorizing Provider  cyclobenzaprine (FLEXERIL) 10 MG tablet Take 1 tablet (10 mg total) by mouth 3 (three) times daily as needed for muscle spasms. 08/16/15  Yes Reubin MilanLaura H Rejoice Heatwole, MD  dicyclomine (BENTYL) 10 MG capsule Take 1 capsule by mouth 4 (four) times daily.   Yes Historical Provider, MD  Norgestimate-Ethinyl Estradiol Triphasic (TRINESSA, 28,) 0.18/0.215/0.25 MG-35 MCG tablet Take 1 tablet by mouth daily. 01/09/15 01/14/16 Yes Harrington ChallengerNancy J Young, NP    Allergies  Allergen Reactions  . Amoxicillin     Other reaction(s): Vomiting    Past Surgical History  Procedure Laterality Date  . Wisdom tooth extraction       Social History  Substance Use Topics  . Smoking status: Never Smoker   . Smokeless tobacco: Never Used  . Alcohol Use: No     Medication list has been reviewed and updated.    Physical Exam  Constitutional: She appears well-developed and well-nourished.  Cardiovascular: Normal rate, regular rhythm and normal heart sounds.   Pulmonary/Chest: Effort normal and breath sounds normal.  Musculoskeletal: She exhibits no edema.       Lumbar back: She exhibits tenderness (over right SI joint > left SI joint). She exhibits no swelling, no edema and no deformity.  ROM hips and knees normal Reflexes normal  Nursing note and vitals reviewed.   BP 110/64 mmHg  Pulse 84  Ht 5\' 5"  (1.651 m)  Wt 209 lb 9.6 oz (95.074 kg)  BMI 34.88 kg/m2  Assessment and Plan: 1. Low back strain, subsequent encounter Stop Flexeril and Aleve Refer for physical therapy - metaxalone (SKELAXIN) 800 MG tablet; Take 1 tablet (800 mg total) by mouth 3 (three) times daily.  Dispense: 90 tablet; Refill: 3 - meloxicam (MOBIC) 15 MG tablet; Take 1 tablet (15 mg total) by mouth daily.  Dispense: 30 tablet; Refill: 3 - Ambulatory referral to Physical Therapy   Bari EdwardLaura Oretta Berkland, MD [facility]  09/19/2015

## 2015-09-20 ENCOUNTER — Other Ambulatory Visit: Payer: Self-pay | Admitting: Internal Medicine

## 2015-09-20 ENCOUNTER — Telehealth: Payer: Self-pay

## 2015-09-20 MED ORDER — METHOCARBAMOL 750 MG PO TABS: 750.0000 mg | ORAL_TABLET | Freq: Four times a day (QID) | ORAL | Status: DC

## 2015-09-20 NOTE — Telephone Encounter (Signed)
Patient cannot afford Skelaxin, it was 206.00, can you send something else.dr

## 2015-09-24 ENCOUNTER — Encounter: Payer: Self-pay | Admitting: Physical Therapy

## 2015-09-24 ENCOUNTER — Ambulatory Visit: Payer: 59 | Attending: Internal Medicine | Admitting: Physical Therapy

## 2015-09-24 DIAGNOSIS — M545 Low back pain, unspecified: Secondary | ICD-10-CM

## 2015-09-24 DIAGNOSIS — R262 Difficulty in walking, not elsewhere classified: Secondary | ICD-10-CM | POA: Diagnosis present

## 2015-09-24 NOTE — Therapy (Signed)
[facility] MAIN [facility] [address] [place], [place], [address] Phone: [phone]   Fax:  [phone]  Physical Therapy Evaluation  Patient Details  Name: Michele MccoyKala N Burnett MRN: [id number] Date of Birth: 10/13/1991 Referring Provider: Bari Edwardlaura Berglund  Encounter Date: 09/24/2015      PT End of Session - 09/24/15 1610    Visit Number 1   Number of Visits 9   Date for PT Re-Evaluation 10/25/15   PT Start Time 0330   PT Stop Time 0430   PT Time Calculation (min) 60 min   Activity Tolerance Patient tolerated treatment well   Behavior [facility] Therapy [facility] for tasks assessed/performed      Past Medical History  Diagnosis Date  . Ovarian cyst rupture 12-26-11  . Fracture 7-12    RIGHT FOOT  . Sinus infection [id number]    ALSO DIAGNOSED TONSILLITIS    Past Surgical History  Procedure Laterality Date  . Wisdom tooth extraction      There were no vitals filed for this visit.  Visit Diagnosis:  Low back pain at multiple sites  Difficulty walking      Subjective Assessment - 09/24/15 1548    Subjective Patient is having 5/10 back pain.    Currently in Pain? Yes   Pain Score 5    Pain Location Back   Pain Descriptors / Indicators Aching;Spasm   Pain Type Chronic pain   Pain Onset More than a month ago   Pain Frequency Constant            [facility] PT Assessment - 09/24/15 0001    Assessment   Medical Diagnosis back pain   Referring Provider laura Judithann GravesBerglund   Onset Date/Surgical Date 06/15/15   Hand Dominance Right   Home Environment   Living Environment Private residence   Living Arrangements Spouse/significant other   Available Help at Discharge Family   Type of Home House   Home Access Stairs to enter   Entrance Stairs-Number of Steps 4   Entrance Stairs-Rails Can reach both   Home Layout One level   Home Equipment None   Prior Function   Level of Independence Independent   Vocation Full time employment   Vocation  Requirements lifting, pulling, bending        PAIN: constant from 3/10-8/10 low back L4-5  POSTURE: WFL   PROM/AROM: WFL  STRENGTH:  Graded on a 0-5 scale Muscle Group Left Right  Shoulder flex    Shoulder Abd    Shoulder Ext    Shoulder IR/ER    Elbow    Wrist/hand    Hip Flex 5 5  Hip Abd 5 5  Hip Add 5 5  Hip Ext 5 5  Hip IR/ER 5 5  Knee Flex 5 5  Knee Ext 5 5  Ankle DF 5 5  Ankle PF 5 5   SENSATION: WFL   SPECIAL TESTS:+ spring test L4-5, + SLR crossed   FUNCTIONAL MOBILITY: painful and guarded   BALANCE: WNL   GAIT: WNL, steps are painful  OUTCOME MEASURES: TEST Outcome Interpretation  ODI 40% Moderate disability                                            PT Education - 09/24/15 1553    Education provided (p) Yes   Person(s) Educated (p) Patient  Methods (p) Explanation   Comprehension (p) Verbalized understanding             PT Long Term Goals - 09/24/15 1615    PT LONG TERM GOAL #1   Title Patient will increase BLE gross strength to 4+/5 as to improve functional strength for independent gait, increased standing tolerance and increased ADL ability.   Time 8   Period Weeks   Status New   PT LONG TERM GOAL #2   Title Patient will report a worst pain of 3/10 on VAS in   low back          to improve tolerance with ADLs and reduced symptoms with activities.    Time 8   Period Weeks   Status New   PT LONG TERM GOAL #3   Title Patient will reduce modified Oswestry score to <20 as to demonstrate minimal disability with ADLs including improved sleeping tolerance, walking/sitting tolerance etc for better mobility with ADLs   Time 8   Period Weeks   Status New               Plan - 09/24/15 1612    Clinical Impression Statement Patient is 24 yr old female with low back pain that is constant and ranges from 3/10-8/10. She is tender to palpation L3,4,5 and has + spring test L4-5. She has spasms and difficulty  with mobility.    Pt will benefit from skilled therapeutic intervention in order to improve on the following deficits Decreased activity tolerance;Decreased strength;Impaired flexibility;Pain;Obesity;Decreased mobility;Difficulty walking   Rehab Potential Good   PT Frequency 2x / week   PT Duration 8 weeks   PT Treatment/Interventions Cryotherapy;Electrical Stimulation;Moist Heat;Traction;Functional mobility training;Gait training;Therapeutic activities;Therapeutic exercise;Manual techniques   PT Next Visit Plan manual therapy, core strengthening   PT Home Exercise Plan prone extension exercises   Consulted and Agree with Plan of Care Patient         Problem List Patient Active Problem List   Diagnosis Date Noted  . 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 Clinical depression 08/16/2015  . STD (sexually transmitted disease) 08/31/2010    Ezekiel Ina 09/24/2015, 4:22 PM  [facility] MAIN [facility] SERVICES [address] [place], [place], [address] Phone: [phone]   Fax:  [phone]  Name: Michele Burnett MRN: [id number] Date of Birth: Jan 12, 1991

## 2015-09-27 ENCOUNTER — Ambulatory Visit: Payer: 59 | Admitting: Physical Therapy

## 2015-09-27 ENCOUNTER — Encounter: Payer: Self-pay | Admitting: Physical Therapy

## 2015-09-27 DIAGNOSIS — M545 Low back pain, unspecified: Secondary | ICD-10-CM

## 2015-09-27 DIAGNOSIS — R262 Difficulty in walking, not elsewhere classified: Secondary | ICD-10-CM

## 2015-09-27 NOTE — Therapy (Signed)
[facility] [address] [place], [place], [address] Phone: [phone]   Fax:  [phone]  Physical Therapy Treatment  Patient Details  Name: Michele Burnett MRN: [id number] Date of Birth: 02/27/1991 Referring Provider: Bari Edwardlaura Berglund  Encounter Date: 09/27/2015      PT End of Session - 09/27/15 1514    Visit Number 2   Number of Visits 9   Date for PT Re-Evaluation 10/25/15   PT Start Time 0230   PT Stop Time 0315   PT Time Calculation (min) 45 min   Activity Tolerance Patient tolerated treatment well   Behavior During Therapy [facility] for tasks assessed/performed      Past Medical History  Diagnosis Date  . Ovarian cyst rupture 12-26-11  . Fracture 7-12    RIGHT FOOT  . Sinus infection [id number]    ALSO DIAGNOSED TONSILLITIS    Past Surgical History  Procedure Laterality Date  . Wisdom tooth extraction      There were no vitals filed for this visit.  Visit Diagnosis:  Low back pain at multiple sites  Difficulty walking      Subjective Assessment - 09/27/15 1513    Subjective Patient is having 5/10 back pain.    Currently in Pain? Yes   Pain Score 5    Pain Location Back   Pain Descriptors / Indicators Aching   Pain Onset More than a month ago      Therapeutic exercise: Core stability training hooklying abd/ER with TA, marching with TA, heel slide with TA Prone extension x 10 x 2, overpressure with prone extension 10 x 2  E-stim and MH , IFC x 20 minutes crossed electrode placement L4-5 x 20 minutes Pain reduction from 5/10 to 2/10 and 0/10 in prone position.                           PT Education - 09/27/15 1514    Education provided Yes   Person(s) Educated Patient   Methods Explanation   Comprehension Verbalized understanding             PT Long Term Goals - 09/24/15 1615    PT LONG TERM GOAL #1   Title Patient will increase BLE gross strength to 4+/5 as to improve  functional strength for independent gait, increased standing tolerance and increased ADL ability.   Time 8   Period Weeks   Status New   PT LONG TERM GOAL #2   Title Patient will report a worst pain of 3/10 on VAS in   low back          to improve tolerance with ADLs and reduced symptoms with activities.    Time 8   Period Weeks   Status New   PT LONG TERM GOAL #3   Title Patient will reduce modified Oswestry score to <20 as to demonstrate minimal disability with ADLs including improved sleeping tolerance, walking/sitting tolerance etc for better mobility with ADLs   Time 8   Period Weeks   Status New               Plan - 09/27/15 1515    Clinical Impression Statement Patient has been performing her HEP and has had continued pain ranging from 5-8/10. She has relief after todays treatment of core stability and MH and e-tim . She has pain relief to 2/10 after session that began at 5/10.   Pt  will benefit from skilled therapeutic intervention in order to improve on the following deficits Decreased activity tolerance;Decreased strength;Impaired flexibility;Pain;Obesity;Decreased mobility;Difficulty walking   Rehab Potential Good   PT Frequency 2x / week   PT Duration 8 weeks   PT Treatment/Interventions Cryotherapy;Electrical Stimulation;Moist Heat;Traction;Functional mobility training;Gait training;Therapeutic activities;Therapeutic exercise;Manual techniques   PT Next Visit Plan manual therapy, core strengthening   PT Home Exercise Plan prone extension exercises   Consulted and Agree with Plan of Care Patient        Problem List Patient Active Problem List   Diagnosis Date Noted  . 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 Clinical depression 08/16/2015  . STD (sexually transmitted disease) 08/31/2010    Ezekiel Ina 09/27/2015, 3:20 PM  [facility] MAIN [facility] SERVICES [address] [place], [place], [address] Phone: [phone]    Fax:  [phone]  Name: Michele Burnett MRN: [id number] Date of Birth: 10/20/1991

## 2015-10-01 ENCOUNTER — Encounter: Payer: 59 | Admitting: Physical Therapy

## 2015-10-01 ENCOUNTER — Ambulatory Visit: Payer: 59 | Admitting: Physical Therapy

## 2015-10-03 ENCOUNTER — Ambulatory Visit: Payer: 59 | Attending: Internal Medicine | Admitting: Physical Therapy

## 2015-10-09 ENCOUNTER — Ambulatory Visit: Payer: Self-pay | Admitting: Family

## 2015-10-09 ENCOUNTER — Encounter: Payer: Self-pay | Admitting: Family

## 2015-10-09 ENCOUNTER — Ambulatory Visit: Payer: 59

## 2015-10-09 VITALS — BP 119/79 | HR 85 | Temp 98.0°F

## 2015-10-09 DIAGNOSIS — H109 Unspecified conjunctivitis: Secondary | ICD-10-CM

## 2015-10-09 MED ORDER — CIPROFLOXACIN HCL 0.3 % OP SOLN: 1.0000 [drp] | OPHTHALMIC | Status: DC

## 2015-10-09 NOTE — Patient Instructions (Signed)

## 2015-10-09 NOTE — Progress Notes (Signed)
S  / R eye was stuck together with matter this am, irritated feeling, no LOV , denies cold sxs, or allergies,   O/ VSS perrla, conjunctiva R mildly red , left clear ENT unremarkable  A/ R conjunctivitis P/ Ciprofloxin opthalmic solution as directed. Note for out of work until R eye is not draining or red.if sxs not improving she should see her eye Dr.

## 2015-10-11 ENCOUNTER — Encounter: Payer: 59 | Admitting: Physical Therapy

## 2015-10-13 ENCOUNTER — Encounter: Payer: Self-pay | Admitting: Internal Medicine

## 2016-01-11 ENCOUNTER — Encounter: Payer: 59 | Admitting: Women's Health

## 2016-01-31 ENCOUNTER — Encounter: Payer: Self-pay | Admitting: Women's Health

## 2016-01-31 ENCOUNTER — Ambulatory Visit (INDEPENDENT_AMBULATORY_CARE_PROVIDER_SITE_OTHER): Payer: Managed Care, Other (non HMO) | Admitting: Women's Health

## 2016-01-31 VITALS — BP 124/82 | Ht 64.0 in | Wt 197.0 lb

## 2016-01-31 DIAGNOSIS — Z01419 Encounter for gynecological examination (general) (routine) without abnormal findings: Secondary | ICD-10-CM | POA: Diagnosis not present

## 2016-01-31 DIAGNOSIS — Z3040 Encounter for surveillance of contraceptives, unspecified: Secondary | ICD-10-CM

## 2016-01-31 LAB — CBC WITH DIFFERENTIAL/PLATELET
BASOS ABS: 0.1 10*3/uL (ref 0.0–0.1)
Basophils Relative: 1 % (ref 0–1)
Eosinophils Absolute: 0.2 10*3/uL (ref 0.0–0.7)
Eosinophils Relative: 2 % (ref 0–5)
HEMATOCRIT: 40.8 % (ref 36.0–46.0)
HEMOGLOBIN: 14 g/dL (ref 12.0–15.0)
LYMPHS ABS: 3.8 10*3/uL (ref 0.7–4.0)
LYMPHS PCT: 44 % (ref 12–46)
MCH: 29.4 pg (ref 26.0–34.0)
MCHC: 34.3 g/dL (ref 30.0–36.0)
MCV: 85.5 fL (ref 78.0–100.0)
MPV: 9.3 fL (ref 8.6–12.4)
Monocytes Absolute: 0.6 10*3/uL (ref 0.1–1.0)
Monocytes Relative: 7 % (ref 3–12)
NEUTROS PCT: 46 % (ref 43–77)
Neutro Abs: 4 10*3/uL (ref 1.7–7.7)
Platelets: 362 10*3/uL (ref 150–400)
RBC: 4.77 MIL/uL (ref 3.87–5.11)
RDW: 12.9 % (ref 11.5–15.5)
WBC: 8.6 10*3/uL (ref 4.0–10.5)

## 2016-01-31 LAB — GLUCOSE, RANDOM: Glucose, Bld: 103 mg/dL — ABNORMAL HIGH (ref 65–99)

## 2016-01-31 NOTE — Progress Notes (Signed)
Michele Burnett 1991/08/01 [id number]    History:    Presents for annual exam.  Regular monthly cycle on Tri-Cyclen would like to try IUD. Normal Pap history. Gardasil series completed. Same partner.  Past medical history, past surgical history, family history and social history were all reviewed and documented in the EPIC chart. 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 Mother borderline diabetes, father history unknown. Marriage planned for October.  ROS:  A ROS was performed and pertinent positives and negatives are included.  Exam:  Filed Vitals:   01/31/16 1551  BP: 124/82    General appearance:  Normal Thyroid:  Symmetrical, normal in size, without palpable masses or nodularity. Respiratory  Auscultation:  Clear without wheezing or rhonchi Cardiovascular  Auscultation:  Regular rate, without rubs, murmurs or gallops  Edema/varicosities:  Not grossly evident Abdominal  Soft,nontender, without masses, guarding or rebound.  Liver/spleen:  No organomegaly noted  Hernia:  None appreciated  Skin  Inspection:  Grossly normal   Breasts: Examined lying and sitting.     Right: Without masses, retractions, discharge or axillary adenopathy.     Left: Without masses, retractions, discharge or axillary adenopathy. Gentitourinary   Inguinal/mons:  Normal without inguinal adenopathy  External genitalia:  Normal  BUS/Urethra/Skene's glands:  Normal  Vagina:  Normal  Cervix:  Normal  Uterus:  normal in size, shape and contour.  Midline and mobile  Adnexa/parametria:     Rt: Without masses or tenderness.   Lt: Without masses or tenderness.  Anus and perineum: Normal    Assessment/Plan:  25 y.o. S WF G0 for annual exam with no complaints.  Monthly cycle on Ortho Tri-Cyclen Contraception management  Plan: Contraception options reviewed would like to try Mirena IUD information given and reviewed slight risk for  hemorrhage, infection, perforation, will check coverage, schedule insertion with  next cycle with Dr. Audie Box . Several days late for starting Ortho-Cyclen NuvaRing inserted today will keep in 3 weeks, condoms encouraged until Mirena IUD inserted. SBE's, regular exercise, calcium rich diet, MVI daily encouraged. Continue to decrease calories for continued weight loss. CBC, glucose, rubella titer, UA, Pap normal 2015, new screening guidelines reviewed.   Harrington Challenger [facility], 4:37 PM 01/31/2016

## 2016-01-31 NOTE — Patient Instructions (Signed)
Health Maintenance, Female Adopting a healthy lifestyle and getting preventive care can go a long way to promote health and wellness. Talk with your health care provider about what schedule of regular examinations is right for you. This is a good chance for you to check in with your provider about disease prevention and staying healthy. In between checkups, there are plenty of things you can do on your own. Experts have done a lot of research about which lifestyle changes and preventive measures are most likely to keep you healthy. Ask your health care provider for more information. WEIGHT AND DIET  Eat a healthy diet  Be sure to include plenty of vegetables, fruits, low-fat dairy products, and lean protein.  Do not eat a lot of foods high in solid fats, added sugars, or salt.  Get regular exercise. This is one of the most important things you can do for your health.  Most adults should exercise for at least 150 minutes each week. The exercise should increase your heart rate and make you sweat (moderate-intensity exercise).  Most adults should also do strengthening exercises at least twice a week. This is in addition to the moderate-intensity exercise.  Maintain a healthy weight  Body mass index (BMI) is a measurement that can be used to identify possible weight problems. It estimates body fat based on height and weight. Your health care provider can help determine your BMI and help you achieve or maintain a healthy weight.  For females 20 years of age and older:   A BMI below 18.5 is considered underweight.  A BMI of 18.5 to 24.9 is normal.  A BMI of 25 to 29.9 is considered overweight.  A BMI of 30 and above is considered obese.  Watch levels of cholesterol and blood lipids  You should start having your blood tested for lipids and cholesterol at 24 years of age, then have this test every 5 years.  You may need to have your cholesterol levels checked more often if:  Your lipid  or cholesterol levels are high.  You are older than 24 years of age.  You are at high risk for heart disease.  CANCER SCREENING   Lung Cancer  Lung cancer screening is recommended for adults 55-80 years old who are at high risk for lung cancer because of a history of smoking.  A yearly low-dose CT scan of the lungs is recommended for people who:  Currently smoke.  Have quit within the past 15 years.  Have at least a 30-pack-year history of smoking. A pack year is smoking an average of one pack of cigarettes a day for 1 year.  Yearly screening should continue until it has been 15 years since you quit.  Yearly screening should stop if you develop a health problem that would prevent you from having lung cancer treatment.  Breast Cancer  Practice breast self-awareness. This means understanding how your breasts normally appear and feel.  It also means doing regular breast self-exams. Let your health care provider know about any changes, no matter how small.  If you are in your 20s or 30s, you should have a clinical breast exam (CBE) by a health care provider every 1-3 years as part of a regular health exam.  If you are 40 or older, have a CBE every year. Also consider having a breast X-ray (mammogram) every year.  If you have a family history of breast cancer, talk to your health care provider about genetic screening.  If you   are at high risk for breast cancer, talk to your health care provider about having an MRI and a mammogram every year.  Breast cancer gene (BRCA) assessment is recommended for women who have family members with BRCA-related cancers. BRCA-related cancers include:  Breast.  Ovarian.  Tubal.  Peritoneal cancers.  Results of the assessment will determine the need for genetic counseling and BRCA1 and BRCA2 testing. Cervical Cancer Your health care provider may recommend that you be screened regularly for cancer of the pelvic organs (ovaries, uterus, and  vagina). This screening involves a pelvic examination, including checking for microscopic changes to the surface of your cervix (Pap test). You may be encouraged to have this screening done every 3 years, beginning at age 21.  For women ages 30-65, health care providers may recommend pelvic exams and Pap testing every 3 years, or they may recommend the Pap and pelvic exam, combined with testing for human papilloma virus (HPV), every 5 years. Some types of HPV increase your risk of cervical cancer. Testing for HPV may also be done on women of any age with unclear Pap test results.  Other health care providers may not recommend any screening for nonpregnant women who are considered low risk for pelvic cancer and who do not have symptoms. Ask your health care provider if a screening pelvic exam is right for you.  If you have had past treatment for cervical cancer or a condition that could lead to cancer, you need Pap tests and screening for cancer for at least 20 years after your treatment. If Pap tests have been discontinued, your risk factors (such as having a new sexual partner) need to be reassessed to determine if screening should resume. Some women have medical problems that increase the chance of getting cervical cancer. In these cases, your health care provider may recommend more frequent screening and Pap tests. Colorectal Cancer  This type of cancer can be detected and often prevented.  Routine colorectal cancer screening usually begins at 24 years of age and continues through 24 years of age.  Your health care provider may recommend screening at an earlier age if you have risk factors for colon cancer.  Your health care provider may also recommend using home test kits to check for hidden blood in the stool.  A small camera at the end of a tube can be used to examine your colon directly (sigmoidoscopy or colonoscopy). This is done to check for the earliest forms of colorectal  cancer.  Routine screening usually begins at age 50.  Direct examination of the colon should be repeated every 5-10 years through 24 years of age. However, you may need to be screened more often if early forms of precancerous polyps or small growths are found. Skin Cancer  Check your skin from head to toe regularly.  Tell your health care provider about any new moles or changes in moles, especially if there is a change in a mole's shape or color.  Also tell your health care provider if you have a mole that is larger than the size of a pencil eraser.  Always use sunscreen. Apply sunscreen liberally and repeatedly throughout the day.  Protect yourself by wearing long sleeves, pants, a wide-brimmed hat, and sunglasses whenever you are outside. HEART DISEASE, DIABETES, AND HIGH BLOOD PRESSURE   High blood pressure causes heart disease and increases the risk of stroke. High blood pressure is more likely to develop in:  People who have blood pressure in the high end   of the normal range (130-139/85-89 mm Hg).  People who are overweight or obese.  People who are African American.  If you are 38-23 years of age, have your blood pressure checked every 3-5 years. If you are 61 years of age or older, have your blood pressure checked every year. You should have your blood pressure measured twice--once when you are at a hospital or clinic, and once when you are not at a hospital or clinic. Record the average of the two measurements. To check your blood pressure when you are not at a hospital or clinic, you can use:  An automated blood pressure machine at a pharmacy.  A home blood pressure monitor.  If you are between 45 years and 39 years old, ask your health care provider if you should take aspirin to prevent strokes.  Have regular diabetes screenings. This involves taking a blood sample to check your fasting blood sugar level.  If you are at a normal weight and have a low risk for diabetes,  have this test once every three years after 24 years of age.  If you are overweight and have a high risk for diabetes, consider being tested at a younger age or more often. PREVENTING INFECTION  Hepatitis B  If you have a higher risk for hepatitis B, you should be screened for this virus. You are considered at high risk for hepatitis B if:  You were born in a country where hepatitis B is common. Ask your health care provider which countries are considered high risk.  Your parents were born in a high-risk country, and you have not been immunized against hepatitis B (hepatitis B vaccine).  You have HIV or AIDS.  You use needles to inject street drugs.  You live with someone who has hepatitis B.  You have had sex with someone who has hepatitis B.  You get hemodialysis treatment.  You take certain medicines for conditions, including cancer, organ transplantation, and autoimmune conditions. Hepatitis C  Blood testing is recommended for:  Everyone born from [birth year] through 1964.  Anyone with known risk factors for hepatitis C. Sexually transmitted infections (STIs)  You should be screened for sexually transmitted infections (STIs) including gonorrhea and chlamydia if:  You are sexually active and are younger than 24 years of age.  You are older than 24 years of age and your health care provider tells you that you are at risk for this type of infection.  Your sexual activity has changed since you were last screened and you are at an increased risk for chlamydia or gonorrhea. Ask your health care provider if you are at risk.  If you do not have HIV, but are at risk, it may be recommended that you take a prescription medicine daily to prevent HIV infection. This is called pre-exposure prophylaxis (PrEP). You are considered at risk if:  You are sexually active and do not regularly use condoms or know the HIV status of your partner(s).  You take drugs by injection.  You are sexually  active with a partner who has HIV. Talk with your health care provider about whether you are at high risk of being infected with HIV. If you choose to begin PrEP, you should first be tested for HIV. You should then be tested every 3 months for as long as you are taking PrEP.  PREGNANCY   If you are premenopausal and you may become pregnant, ask your health care provider about preconception counseling.  If you may  become pregnant, take 400 to 800 micrograms (mcg) of folic acid every day.  If you want to prevent pregnancy, talk to your health care provider about birth control (contraception). OSTEOPOROSIS AND MENOPAUSE   Osteoporosis is a disease in which the bones lose minerals and strength with aging. This can result in serious bone fractures. Your risk for osteoporosis can be identified using a bone density scan.  If you are 65 years of age or older, or if you are at risk for osteoporosis and fractures, ask your health care provider if you should be screened.  Ask your health care provider whether you should take a calcium or vitamin D supplement to lower your risk for osteoporosis.  Menopause may have certain physical symptoms and risks.  Hormone replacement therapy may reduce some of these symptoms and risks. Talk to your health care provider about whether hormone replacement therapy is right for you.  HOME CARE INSTRUCTIONS   Schedule regular health, dental, and eye exams.  Stay current with your immunizations.   Do not use any tobacco products including cigarettes, chewing tobacco, or electronic cigarettes.  If you are pregnant, do not drink alcohol.  If you are breastfeeding, limit how much and how often you drink alcohol.  Limit alcohol intake to no more than 1 drink per day for nonpregnant women. One drink equals 12 ounces of beer, 5 ounces of wine, or 1 ounces of hard liquor.  Do not use street drugs.  Do not share needles.  Ask your health care provider for help if  you need support or information about quitting drugs.  Tell your health care provider if you often feel depressed.  Tell your health care provider if you have ever been abused or do not feel safe at home.   This information is not intended to replace advice given to you by your health care provider. Make sure you discuss any questions you have with your health care provider.   Document Released: 06/02/2011 Document Revised: 12/08/2014 Document Reviewed: 10/19/2013 Elsevier Interactive Patient Education 2016 Elsevier Inc. Levonorgestrel intrauterine device (IUD) What is this medicine? LEVONORGESTREL IUD (LEE voe nor jes trel) is a contraceptive (birth control) device. The device is placed inside the uterus by a healthcare professional. It is used to prevent pregnancy and can also be used to treat heavy bleeding that occurs during your period. Depending on the device, it can be used for 3 to 5 years. This medicine may be used for other purposes; ask your health care provider or pharmacist if you have questions. What should I tell my health care provider before I take this medicine? They need to know if you have any of these conditions: -abnormal Pap smear -cancer of the breast, uterus, or cervix -diabetes -endometritis -genital or pelvic infection now or in the past -have more than one sexual partner or your partner has more than one partner -heart disease -history of an ectopic or tubal pregnancy -immune system problems -IUD in place -liver disease or tumor -problems with blood clots or take blood-thinners -use intravenous drugs -uterus of unusual shape -vaginal bleeding that has not been explained -an unusual or allergic reaction to levonorgestrel, other hormones, silicone, or polyethylene, medicines, foods, dyes, or preservatives -pregnant or trying to get pregnant -breast-feeding How should I use this medicine? This device is placed inside the uterus by a health care  professional. Talk to your pediatrician regarding the use of this medicine in children. Special care may be needed. Overdosage: If you think   you have taken too much of this medicine contact a poison control center or emergency room at once. NOTE: This medicine is only for you. Do not share this medicine with others. What if I miss a dose? This does not apply. What may interact with this medicine? Do not take this medicine with any of the following medications: -amprenavir -bosentan -fosamprenavir This medicine may also interact with the following medications: -aprepitant -barbiturate medicines for inducing sleep or treating seizures -bexarotene -griseofulvin -medicines to treat seizures like carbamazepine, ethotoin, felbamate, oxcarbazepine, phenytoin, topiramate -modafinil -pioglitazone -rifabutin -rifampin -rifapentine -some medicines to treat HIV infection like atazanavir, indinavir, lopinavir, nelfinavir, tipranavir, ritonavir -St. John's wort -warfarin This list may not describe all possible interactions. Give your health care provider a list of all the medicines, herbs, non-prescription drugs, or dietary supplements you use. Also tell them if you smoke, drink alcohol, or use illegal drugs. Some items may interact with your medicine. What should I watch for while using this medicine? Visit your doctor or health care professional for regular check ups. See your doctor if you or your partner has sexual contact with others, becomes HIV positive, or gets a sexual transmitted disease. This product does not protect you against HIV infection (AIDS) or other sexually transmitted diseases. You can check the placement of the IUD yourself by reaching up to the top of your vagina with clean fingers to feel the threads. Do not pull on the threads. It is a good habit to check placement after each menstrual period. Call your doctor right away if you feel more of the IUD than just the threads or if  you cannot feel the threads at all. The IUD may come out by itself. You may become pregnant if the device comes out. If you notice that the IUD has come out use a backup birth control method like condoms and call your health care provider. Using tampons will not change the position of the IUD and are okay to use during your period. What side effects may I notice from receiving this medicine? Side effects that you should report to your doctor or health care professional as soon as possible: -allergic reactions like skin rash, itching or hives, swelling of the face, lips, or tongue -fever, flu-like symptoms -genital sores -high blood pressure -no menstrual period for 6 weeks during use -pain, swelling, warmth in the leg -pelvic pain or tenderness -severe or sudden headache -signs of pregnancy -stomach cramping -sudden shortness of breath -trouble with balance, talking, or walking -unusual vaginal bleeding, discharge -yellowing of the eyes or skin Side effects that usually do not require medical attention (report to your doctor or health care professional if they continue or are bothersome): -acne -breast pain -change in sex drive or performance -changes in weight -cramping, dizziness, or faintness while the device is being inserted -headache -irregular menstrual bleeding within first 3 to 6 months of use -nausea This list may not describe all possible side effects. Call your doctor for medical advice about side effects. You may report side effects to FDA at 1-800-FDA-1088. Where should I keep my medicine? This does not apply. NOTE: This sheet is a summary. It may not cover all possible information. If you have questions about this medicine, talk to your doctor, pharmacist, or health care provider.    2016, Elsevier/Gold Standard. (2011-12-18 13:54:04)

## 2016-02-01 ENCOUNTER — Encounter: Payer: Self-pay | Admitting: Women's Health

## 2016-02-01 LAB — RUBELLA SCREEN: Rubella: 5.99 Index — ABNORMAL HIGH (ref <0.90)

## 2016-02-04 ENCOUNTER — Telehealth: Payer: Self-pay | Admitting: Gynecology

## 2016-02-04 NOTE — Telephone Encounter (Signed)
02/04/16-I LM VM for pt that her [employer] Ins will cover the Mirena for contraception at 100%, no copay per [email or web address] . ZHY#[id number]#[id number]

## 2016-02-14 ENCOUNTER — Telehealth: Payer: Self-pay | Admitting: *Deleted

## 2016-02-14 NOTE — Telephone Encounter (Signed)
Pt called having Mirena IUD placed on 02/19/16 has nuvaring, asked when should she removed. I told pt per nancy note on 01/31/16 "inserted today will keep in 3 weeks, condoms encouraged until Mirena IUD inserted" pt verbalized she understood.

## 2016-02-19 ENCOUNTER — Ambulatory Visit (INDEPENDENT_AMBULATORY_CARE_PROVIDER_SITE_OTHER): Payer: Managed Care, Other (non HMO) | Admitting: Gynecology

## 2016-02-19 ENCOUNTER — Encounter: Payer: Self-pay | Admitting: Gynecology

## 2016-02-19 VITALS — BP 124/76

## 2016-02-19 DIAGNOSIS — Z30430 Encounter for insertion of intrauterine contraceptive device: Secondary | ICD-10-CM

## 2016-02-19 HISTORY — PX: INTRAUTERINE DEVICE INSERTION: SHX323

## 2016-02-19 NOTE — Patient Instructions (Signed)
Intrauterine Device Insertion Most often, an intrauterine device (IUD) is inserted into the uterus to prevent pregnancy. There are 2 types of IUDs available:  Copper IUD--This type of IUD creates an environment that is not favorable to sperm survival. The mechanism of action of the copper IUD is not known for certain. It can stay in place for 10 years.  Hormone IUD--This type of IUD contains the hormone progestin (synthetic progesterone). The progestin thickens the cervical mucus and prevents sperm from entering the uterus, and it also thins the uterine lining. There is no evidence that the hormone IUD prevents implantation. One hormone IUD can stay in place for up to 5 years, and a different hormone IUD can stay in place for up to 3 years. An IUD is the most cost-effective birth control if left in place for the full duration. It may be removed at any time. LET YOUR HEALTH CARE PROVIDER KNOW ABOUT:  Any allergies you have.  All medicines you are taking, including vitamins, herbs, eye drops, creams, and over-the-counter medicines.  Previous problems you or members of your family have had with the use of anesthetics.  Any blood disorders you have.  Previous surgeries you have had.  Possibility of pregnancy.  Medical conditions you have. RISKS AND COMPLICATIONS  Generally, intrauterine device insertion is a safe procedure. However, as with any procedure, complications can occur. Possible complications include:  Accidental puncture (perforation) of the uterus.  Accidental placement of the IUD either in the muscle layer of the uterus (myometrium) or outside the uterus. If this happens, the IUD can be found essentially floating around the bowels and must be taken out surgically.  The IUD may fall out of the uterus (expulsion). This is more common in women who have recently had a child.   Pregnancy in the fallopian tube (ectopic).  Pelvic inflammatory disease (PID), which is infection of  the uterus and fallopian tubes. The risk of PID is slightly increased in the first 20 days after the IUD is placed, but the overall risk is still very low. BEFORE THE PROCEDURE  Schedule the IUD insertion for when you will have your menstrual period or right after, to make sure you are not pregnant. Placement of the IUD is better tolerated shortly after a menstrual cycle.  You may need to take tests or be examined to make sure you are not pregnant.  You may be required to take a pregnancy test.  You may be required to get checked for sexually transmitted infections (STIs) prior to placement. Placing an IUD in someone who has an infection can make the infection worse.  You may be given a pain reliever to take 1 or 2 hours before the procedure.  An exam will be performed to determine the size and position of your uterus.  Ask your health care provider about changing or stopping your regular medicines. PROCEDURE   A tool (speculum) is placed in the vagina. This allows your health care provider to see the lower part of the uterus (cervix).  The cervix is prepped with a medicine that lowers the risk of infection.  You may be given a medicine to numb each side of the cervix (intracervical or paracervical block). This is used to block and control any discomfort with insertion.  A tool (uterine sound) is inserted into the uterus to determine the length of the uterine cavity and the direction the uterus may be tilted.  A slim instrument (IUD inserter) is inserted through the cervical   canal and into your uterus.  The IUD is placed in the uterine cavity and the insertion device is removed.  The nylon string that is attached to the IUD and used for eventual IUD removal is trimmed. It is trimmed so that it lays high in the vagina, just outside the cervix. AFTER THE PROCEDURE  You may have bleeding after the procedure. This is normal. It varies from light spotting for a few days to menstrual-like  bleeding.  You may have mild cramping.   This information is not intended to replace advice given to you by your health care provider. Make sure you discuss any questions you have with your health care provider.   Document Released: 07/16/2011 Document Revised: 09/07/2013 Document Reviewed: 05/08/2013 Elsevier Interactive Patient Education 2016 Elsevier Inc.  

## 2016-02-19 NOTE — Progress Notes (Signed)
    Georgiann MccoyKala N Perry 03/04/1991 [id number]        25 y.o.  G0P0  presents for Mirena IUD placement. She previously has been counseled by Harriett SineNancy.  She has read through the booklet, has no contraindications and signed the consent form. She currently is on a normal menses.  I reviewed the insertional process with her as well as the risks to include infection, either immediate or long-term, uterine perforation or migration requiring surgery to remove, other complications such as pain, hormonal side effects, infertility and possibility of failure with subsequent pregnancy.   Exam with Kennon PortelaKim Gardner assistant Filed Vitals:   02/19/16 1454  BP: 124/76    Pelvic: External BUS vagina normal. Cervix normal with light menses flow. Uterus anteverted normal size shape contour midline mobile nontender. Adnexa without masses or tenderness.  Procedure: The cervix was cleansed with Betadine, anterior lip grasped with a single-tooth tenaculum, the uterus was sounded and a Mirena IUD was placed according to manufacturer's recommendations without difficulty. The strings were trimmed. The patient tolerated well and will follow up in one month for a postinsertional check.  Lot number:  [id number]    Dara LordsFONTAINE,Valeska Haislip P MD, 3:15 PM 02/19/2016

## 2016-02-21 ENCOUNTER — Encounter: Payer: Self-pay | Admitting: Gynecology

## 2016-03-18 ENCOUNTER — Ambulatory Visit (INDEPENDENT_AMBULATORY_CARE_PROVIDER_SITE_OTHER): Payer: Managed Care, Other (non HMO) | Admitting: Gynecology

## 2016-03-18 ENCOUNTER — Encounter: Payer: Self-pay | Admitting: Gynecology

## 2016-03-18 VITALS — BP 124/80

## 2016-03-18 DIAGNOSIS — Z30431 Encounter for routine checking of intrauterine contraceptive device: Secondary | ICD-10-CM | POA: Diagnosis not present

## 2016-03-18 NOTE — Patient Instructions (Signed)
Follow up March 2018 for annual exam when due, sooner if any issues.

## 2016-03-18 NOTE — Progress Notes (Signed)
    Michele MccoyKala N Burnett 09/04/1991 [id number]        25 y.o.  G0P0 Presents for follow up IUD check.  Had Mirena IUD placed 02/19/2016. Has done well with no issues. Just started her menses today.  Past medical history,surgical history, problem list, medications, allergies, family history and social history were all reviewed and documented in the EPIC chart.  Directed ROS with pertinent positives and negatives documented in the history of present illness/assessment and plan.  Exam: Kennon PortelaKim Gardner assistant Filed Vitals:   03/18/16 1538  BP: 124/80   General appearance:  Normal Abdomen soft nontender without masses guarding rebound Pelvic external BUS vagina with slight menses flow. Cervix normal. IUD string visualized in appropriate length. Uterus normal size midline mobile nontender. Adnexa without masses or tenderness.  Assessment/Plan:  [age] y.o. G0P0 with normal IUD check.  Follow up in March 2018 when due for annual exam, sooner as needed.    Dara LordsFONTAINE,Geneal Huebert P MD, 3:52 PM 03/18/2016

## 2017-02-19 ENCOUNTER — Ambulatory Visit (INDEPENDENT_AMBULATORY_CARE_PROVIDER_SITE_OTHER): Payer: Managed Care, Other (non HMO) | Admitting: Women's Health

## 2017-02-19 ENCOUNTER — Encounter: Payer: Self-pay | Admitting: Women's Health

## 2017-02-19 VITALS — BP 110/78 | Ht 64.0 in | Wt 229.0 lb

## 2017-02-19 DIAGNOSIS — Z975 Presence of (intrauterine) contraceptive device: Secondary | ICD-10-CM

## 2017-02-19 DIAGNOSIS — Z01419 Encounter for gynecological examination (general) (routine) without abnormal findings: Secondary | ICD-10-CM | POA: Diagnosis not present

## 2017-02-19 NOTE — Progress Notes (Signed)
Maye HidesKala N Wilcher 02/25/1991 [id number]    History:    Presents for annual exam.  Mirena IUD placed 01/2016 rare spotting since. Normal Pap history. Gardasil series completed. Same partner married this past year.  Past medical history, past surgical history, family history and social history were all reviewed and documented in the EPIC chart. Mother hypertension.  ROS:  A ROS was performed and pertinent positives and negatives are included.  Exam:  Vitals:   02/19/17 1552  BP: 110/78  Weight: 229 lb (103.9 kg)  Height: 5\' 4"  (1.626 m)   Body mass index is 39.31 kg/m.   General appearance:  Normal Thyroid:  Symmetrical, normal in size, without palpable masses or nodularity. Respiratory  Auscultation:  Clear without wheezing or rhonchi Cardiovascular  Auscultation:  Regular rate, without rubs, murmurs or gallops  Edema/varicosities:  Not grossly evident Abdominal  Soft,nontender, without masses, guarding or rebound.  Liver/spleen:  No organomegaly noted  Hernia:  None appreciated  Skin  Inspection:  Grossly normal   Breasts: Examined lying and sitting.     Right: Without masses, retractions, discharge or axillary adenopathy.     Left: Without masses, retractions, discharge or axillary adenopathy. Gentitourinary   Inguinal/mons:  Normal without inguinal adenopathy  External genitalia:  Normal  BUS/Urethra/Skene's glands:  Normal  Vagina:  Normal  Cervix:  Normal IUD string in os  Uterus:  normal in size, shape and contour.  Midline and mobile  Adnexa/parametria:     Rt: Without masses or tenderness.   Lt: Without masses or tenderness.  Anus and perineum: Normal    Assessment/Plan:  26 y.o. MWF G0  for annual exam with no complaints  01/2016 Mirena IUD Obesity  Plan: Reviewed importance of decreasing calories and increasing exercise for weight loss, has gained 30 pounds in the past year. SBE's, exercise, calcium rich diet, MVI daily encouraged. CBC, glucose, UA, Pap. Pap  normal 2015, new screening guidelines reviewed.    Harrington ChallengerYOUNG,Lilymarie Scroggins J [facility] CenterWHNP, 5:06 PM 02/19/2017

## 2017-02-19 NOTE — Patient Instructions (Addendum)
Carbohydrate Counting for Diabetes Mellitus, Adult Carbohydrate counting is a method for keeping track of how many carbohydrates you eat. Eating carbohydrates naturally increases the amount of sugar (glucose) in the blood. Counting how many carbohydrates you eat helps keep your blood glucose within normal limits, which helps you manage your diabetes (diabetes mellitus). It is important to know how many carbohydrates you can safely have in each meal. This is different for every person. A diet and nutrition specialist (registered dietitian) can help you make a meal plan and calculate how many carbohydrates you should have at each meal and snack. Carbohydrates are found in the following foods:  Grains, such as breads and cereals.  Dried beans and soy products.  Starchy vegetables, such as potatoes, peas, and corn.  Fruit and fruit juices.  Milk and yogurt.  Sweets and snack foods, such as cake, cookies, candy, chips, and soft drinks. How do I count carbohydrates? There are two ways to count carbohydrates in food. You can use either of the methods or a combination of both. Reading "Nutrition Facts" on packaged food  The "Nutrition Facts" list is included on the labels of almost all packaged foods and beverages in the [place] It includes:  The serving size.  Information about nutrients in each serving, including the grams (g) of carbohydrate per serving. To use the "Nutrition Facts":  Decide how many servings you will have.  Multiply the number of servings by the number of carbohydrates per serving.  The resulting number is the total amount of carbohydrates that you will be having. Learning standard serving sizes of other foods  When you eat foods containing carbohydrates that are not packaged or do not include "Nutrition Facts" on the label, you need to measure the servings in order to count the amount of carbohydrates:  Measure the foods that you will eat with a food scale or measuring  cup, if needed.  Decide how many standard-size servings you will eat.  Multiply the number of servings by 15. Most carbohydrate-rich foods have about 15 g of carbohydrates per serving.  For example, if you eat 8 oz (170 g) of strawberries, you will have eaten 2 servings and 30 g of carbohydrates (2 servings x 15 g = 30 g).  For foods that have more than one food mixed, such as soups and casseroles, you must count the carbohydrates in each food that is included. The following list contains standard serving sizes of common carbohydrate-rich foods. Each of these servings has about 15 g of carbohydrates:   hamburger bun or  English muffin.   oz (15 mL) syrup.   oz (14 g) jelly.  1 slice of bread.  1 six-inch tortilla.  3 oz (85 g) cooked rice or pasta.  4 oz (113 g) cooked dried beans.  4 oz (113 g) starchy vegetable, such as peas, corn, or potatoes.  4 oz (113 g) hot cereal.  4 oz (113 g) mashed potatoes or  of a large baked potato.  4 oz (113 g) canned or frozen fruit.  4 oz (120 mL) fruit juice.  4-6 crackers.  6 chicken nuggets.  6 oz (170 g) unsweetened dry cereal.  6 oz (170 g) plain fat-free yogurt or yogurt sweetened with artificial sweeteners.  8 oz (240 mL) milk.  8 oz (170 g) fresh fruit or one small piece of fruit.  24 oz (680 g) popped popcorn. Example of carbohydrate counting Sample meal   3 oz (85 g) chicken breast.  6   oz (170 g) brown rice.  4 oz (113 g) corn.  8 oz (240 mL) milk.  8 oz (170 g) strawberries with sugar-free whipped topping. Carbohydrate calculation  1. Identify the foods that contain carbohydrates:  Rice.  Corn.  Milk.  Strawberries. 2. Calculate how many servings you have of each food:  2 servings rice.  1 serving corn.  1 serving milk.  1 serving strawberries. 3. Multiply each number of servings by 15 g:  2 servings rice x 15 g = 30 g.  1 serving corn x 15 g = 15 g.  1 serving milk x 15 g = 15  g.  1 serving strawberries x 15 g = 15 g. 4. Add together all of the amounts to find the total grams of carbohydrates eaten:  30 g + 15 g + 15 g + 15 g = 75 g of carbohydrates total. This information is not intended to replace advice given to you by your health care provider. Make sure you discuss any questions you have with your health care provider. Document Released: 11/17/2005 Document Revised: 06/06/2016 Document Reviewed: 04/30/2016 Elsevier Interactive Patient Education  2017 [employer] Health Maintenance, Female Adopting a healthy lifestyle and getting preventive care can go a long way to promote health and wellness. Talk with your health care provider about what schedule of regular examinations is right for you. This is a good chance for you to check in with your provider about disease prevention and staying healthy. In between checkups, there are plenty of things you can do on your own. Experts have done a lot of research about which lifestyle changes and preventive measures are most likely to keep you healthy. Ask your health care provider for more information. Weight and diet Eat a healthy diet  Be sure to include plenty of vegetables, fruits, low-fat dairy products, and lean protein.  Do not eat a lot of foods high in solid fats, added sugars, or salt.  Get regular exercise. This is one of the most important things you can do for your health.  Most adults should exercise for at least 150 minutes each week. The exercise should increase your heart rate and make you sweat (moderate-intensity exercise).  Most adults should also do strengthening exercises at least twice a week. This is in addition to the moderate-intensity exercise. Maintain a healthy weight  Body mass index (BMI) is a measurement that can be used to identify possible weight problems. It estimates body fat based on height and weight. Your health care provider can help determine your BMI and help you achieve or  maintain a healthy weight.  For females 20 years of age and older:  A BMI below 18.5 is considered underweight.  A BMI of 18.5 to 24.9 is normal.  A BMI of 25 to 29.9 is considered overweight.  A BMI of 30 and above is considered obese. Watch levels of cholesterol and blood lipids  You should start having your blood tested for lipids and cholesterol at 25 years of age, then have this test every 5 years.  You may need to have your cholesterol levels checked more often if:  Your lipid or cholesterol levels are high.  You are older than 25 years of age.  You are at high risk for heart disease. Cancer screening Lung Cancer  Lung cancer screening is recommended for adults 55-80 years old who are at high risk for lung cancer because of a history of smoking.  A yearly low-dose CT   scan of the lungs is recommended for people who:  Currently smoke.  Have quit within the past 15 years.  Have at least a 30-pack-year history of smoking. A pack year is smoking an average of one pack of cigarettes a day for 1 year.  Yearly screening should continue until it has been 15 years since you quit.  Yearly screening should stop if you develop a health problem that would prevent you from having lung cancer treatment. Breast Cancer  Practice breast self-awareness. This means understanding how your breasts normally appear and feel.  It also means doing regular breast self-exams. Let your health care provider know about any changes, no matter how small.  If you are in your 20s or 30s, you should have a clinical breast exam (CBE) by a health care provider every 1-3 years as part of a regular health exam.  If you are 40 or older, have a CBE every year. Also consider having a breast X-ray (mammogram) every year.  If you have a family history of breast cancer, talk to your health care provider about genetic screening.  If you are at high risk for breast cancer, talk to your health care provider  about having an MRI and a mammogram every year.  Breast cancer gene (BRCA) assessment is recommended for women who have family members with BRCA-related cancers. BRCA-related cancers include:  Breast.  Ovarian.  Tubal.  Peritoneal cancers.  Results of the assessment will determine the need for genetic counseling and BRCA1 and BRCA2 testing. Cervical Cancer  Your health care provider may recommend that you be screened regularly for cancer of the pelvic organs (ovaries, uterus, and vagina). This screening involves a pelvic examination, including checking for microscopic changes to the surface of your cervix (Pap test). You may be encouraged to have this screening done every 3 years, beginning at age 21.  For women ages 30-65, health care providers may recommend pelvic exams and Pap testing every 3 years, or they may recommend the Pap and pelvic exam, combined with testing for human papilloma virus (HPV), every 5 years. Some types of HPV increase your risk of cervical cancer. Testing for HPV may also be done on women of any age with unclear Pap test results.  Other health care providers may not recommend any screening for nonpregnant women who are considered low risk for pelvic cancer and who do not have symptoms. Ask your health care provider if a screening pelvic exam is right for you.  If you have had past treatment for cervical cancer or a condition that could lead to cancer, you need Pap tests and screening for cancer for at least 20 years after your treatment. If Pap tests have been discontinued, your risk factors (such as having a new sexual partner) need to be reassessed to determine if screening should resume. Some women have medical problems that increase the chance of getting cervical cancer. In these cases, your health care provider may recommend more frequent screening and Pap tests. Colorectal Cancer  This type of cancer can be detected and often prevented.  Routine colorectal  cancer screening usually begins at 25 years of age and continues through 25 years of age.  Your health care provider may recommend screening at an earlier age if you have risk factors for colon cancer.  Your health care provider may also recommend using home test kits to check for hidden blood in the stool.  A small camera at the end of a tube can be used   to examine your colon directly (sigmoidoscopy or colonoscopy). This is done to check for the earliest forms of colorectal cancer.  Routine screening usually begins at age 50.  Direct examination of the colon should be repeated every 5-10 years through 25 years of age. However, you may need to be screened more often if early forms of precancerous polyps or small growths are found. Skin Cancer  Check your skin from head to toe regularly.  Tell your health care provider about any new moles or changes in moles, especially if there is a change in a mole's shape or color.  Also tell your health care provider if you have a mole that is larger than the size of a pencil eraser.  Always use sunscreen. Apply sunscreen liberally and repeatedly throughout the day.  Protect yourself by wearing long sleeves, pants, a wide-brimmed hat, and sunglasses whenever you are outside. Heart disease, diabetes, and high blood pressure  High blood pressure causes heart disease and increases the risk of stroke. High blood pressure is more likely to develop in:  People who have blood pressure in the high end of the normal range (130-139/85-89 mm Hg).  People who are overweight or obese.  People who are African American.  If you are 18-39 years of age, have your blood pressure checked every 3-5 years. If you are 40 years of age or older, have your blood pressure checked every year. You should have your blood pressure measured twice-once when you are at a hospital or clinic, and once when you are not at a hospital or clinic. Record the average of the two  measurements. To check your blood pressure when you are not at a hospital or clinic, you can use:  An automated blood pressure machine at a pharmacy.  A home blood pressure monitor.  If you are between 55 years and 79 years old, ask your health care provider if you should take aspirin to prevent strokes.  Have regular diabetes screenings. This involves taking a blood sample to check your fasting blood sugar level.  If you are at a normal weight and have a low risk for diabetes, have this test once every three years after 25 years of age.  If you are overweight and have a high risk for diabetes, consider being tested at a younger age or more often. Preventing infection Hepatitis B  If you have a higher risk for hepatitis B, you should be screened for this virus. You are considered at high risk for hepatitis B if:  You were born in a country where hepatitis B is common. Ask your health care provider which countries are considered high risk.  Your parents were born in a high-risk country, and you have not been immunized against hepatitis B (hepatitis B vaccine).  You have HIV or AIDS.  You use needles to inject street drugs.  You live with someone who has hepatitis B.  You have had sex with someone who has hepatitis B.  You get hemodialysis treatment.  You take certain medicines for conditions, including cancer, organ transplantation, and autoimmune conditions. Hepatitis C  Blood testing is recommended for:  Everyone born from 1945 through 1965.  Anyone with known risk factors for hepatitis C. Sexually transmitted infections (STIs)  You should be screened for sexually transmitted infections (STIs) including gonorrhea and chlamydia if:  You are sexually active and are younger than 24 years of age.  You are older than 24 years of age and your health care provider   tells you that you are at risk for this type of infection.  Your sexual activity has changed since you were last  screened and you are at an increased risk for chlamydia or gonorrhea. Ask your health care provider if you are at risk.  If you do not have HIV, but are at risk, it may be recommended that you take a prescription medicine daily to prevent HIV infection. This is called pre-exposure prophylaxis (PrEP). You are considered at risk if:  You are sexually active and do not regularly use condoms or know the HIV status of your partner(s).  You take drugs by injection.  You are sexually active with a partner who has HIV. Talk with your health care provider about whether you are at high risk of being infected with HIV. If you choose to begin PrEP, you should first be tested for HIV. You should then be tested every 3 months for as long as you are taking PrEP. Pregnancy  If you are premenopausal and you may become pregnant, ask your health care provider about preconception counseling.  If you may become pregnant, take 400 to 800 micrograms (mcg) of folic acid every day.  If you want to prevent pregnancy, talk to your health care provider about birth control (contraception). Osteoporosis and menopause  Osteoporosis is a disease in which the bones lose minerals and strength with aging. This can result in serious bone fractures. Your risk for osteoporosis can be identified using a bone density scan.  If you are 65 years of age or older, or if you are at risk for osteoporosis and fractures, ask your health care provider if you should be screened.  Ask your health care provider whether you should take a calcium or vitamin D supplement to lower your risk for osteoporosis.  Menopause may have certain physical symptoms and risks.  Hormone replacement therapy may reduce some of these symptoms and risks. Talk to your health care provider about whether hormone replacement therapy is right for you. Follow these instructions at home:  Schedule regular health, dental, and eye exams.  Stay current with your  immunizations.  Do not use any tobacco products including cigarettes, chewing tobacco, or electronic cigarettes.  If you are pregnant, do not drink alcohol.  If you are breastfeeding, limit how much and how often you drink alcohol.  Limit alcohol intake to no more than 1 drink per day for nonpregnant women. One drink equals 12 ounces of beer, 5 ounces of wine, or 1 ounces of hard liquor.  Do not use street drugs.  Do not share needles.  Ask your health care provider for help if you need support or information about quitting drugs.  Tell your health care provider if you often feel depressed.  Tell your health care provider if you have ever been abused or do not feel safe at home. This information is not intended to replace advice given to you by your health care provider. Make sure you discuss any questions you have with your health care provider. Document Released: 06/02/2011 Document Revised: 04/24/2016 Document Reviewed: 08/21/2015 Elsevier Interactive Patient Education  2017 [employer] Inc[employer]  

## 2017-02-20 ENCOUNTER — Encounter: Payer: Self-pay | Admitting: Women's Health

## 2017-02-20 LAB — CBC WITH DIFFERENTIAL/PLATELET
Basophils Absolute: 0 cells/uL (ref 0–200)
Basophils Relative: 0 %
EOS PCT: 2 %
Eosinophils Absolute: 220 cells/uL (ref 15–500)
HCT: 39.9 % (ref 35.0–45.0)
Hemoglobin: 12.9 g/dL (ref 11.7–15.5)
LYMPHS ABS: 3630 {cells}/uL (ref 850–3900)
LYMPHS PCT: 33 %
MCH: 28.4 pg (ref 27.0–33.0)
MCHC: 32.3 g/dL (ref 32.0–36.0)
MCV: 87.9 fL (ref 80.0–100.0)
MONOS PCT: 8 %
MPV: 9 fL (ref 7.5–12.5)
Monocytes Absolute: 880 cells/uL (ref 200–950)
NEUTROS PCT: 57 %
Neutro Abs: 6270 cells/uL (ref 1500–7800)
PLATELETS: 350 10*3/uL (ref 140–400)
RBC: 4.54 MIL/uL (ref 3.80–5.10)
RDW: 13.5 % (ref 11.0–15.0)
WBC: 11 10*3/uL — AB (ref 3.8–10.8)

## 2017-02-20 LAB — PAP IG W/ RFLX HPV ASCU

## 2017-02-20 LAB — GLUCOSE, RANDOM: GLUCOSE: 82 mg/dL (ref 65–99)

## 2017-06-30 IMAGING — CR DG LUMBAR SPINE COMPLETE 4+V
5 series · 5 of 5 positions shown · non-contrast
Comparison: None.

CLINICAL DATA: CVA.  Back injury.

EXAM:
LUMBAR SPINE - COMPLETE 4+ VIEW

[l-spine ap]
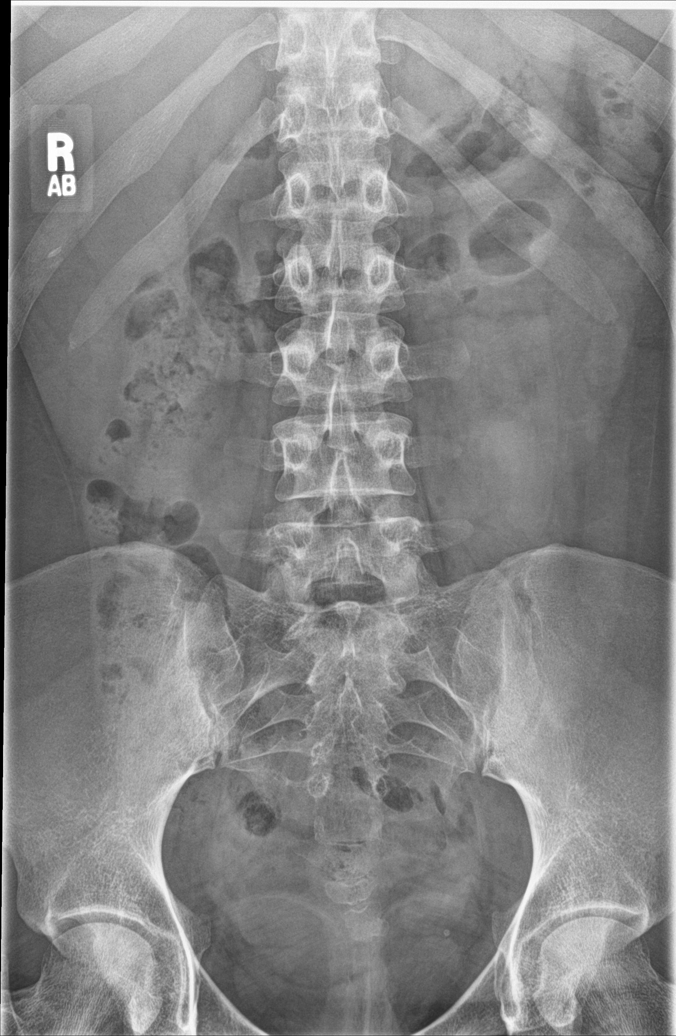

[l-spine obl (1 of 2)]
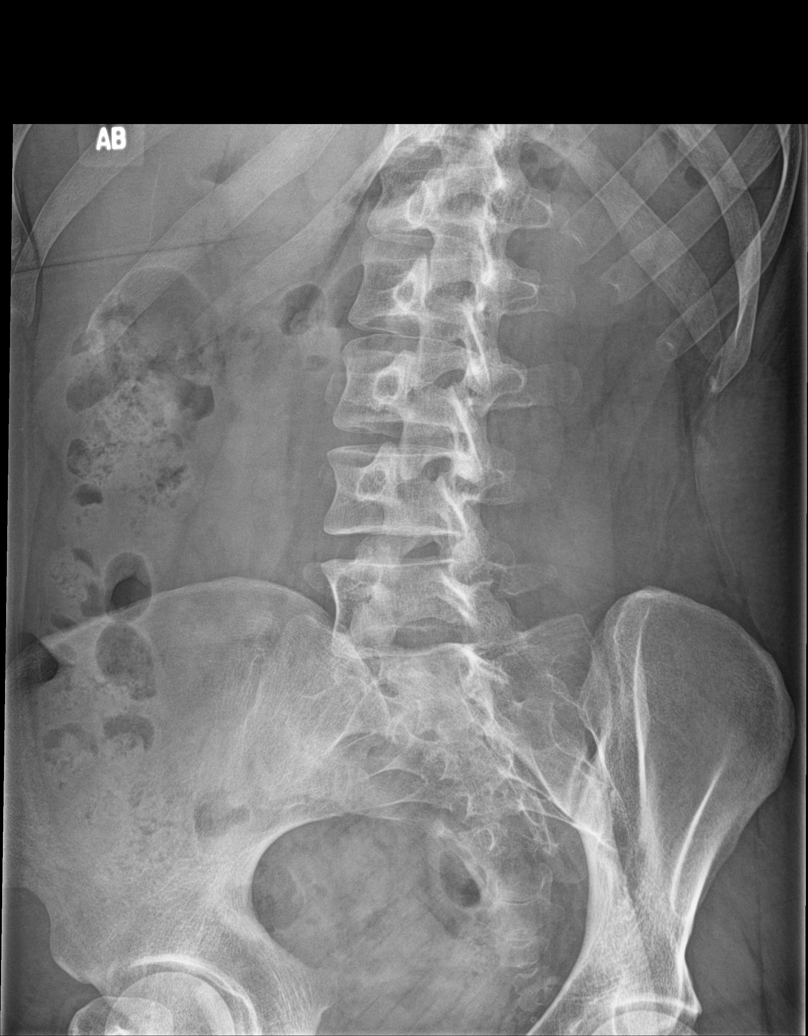

[l-spine obl (2 of 2)]
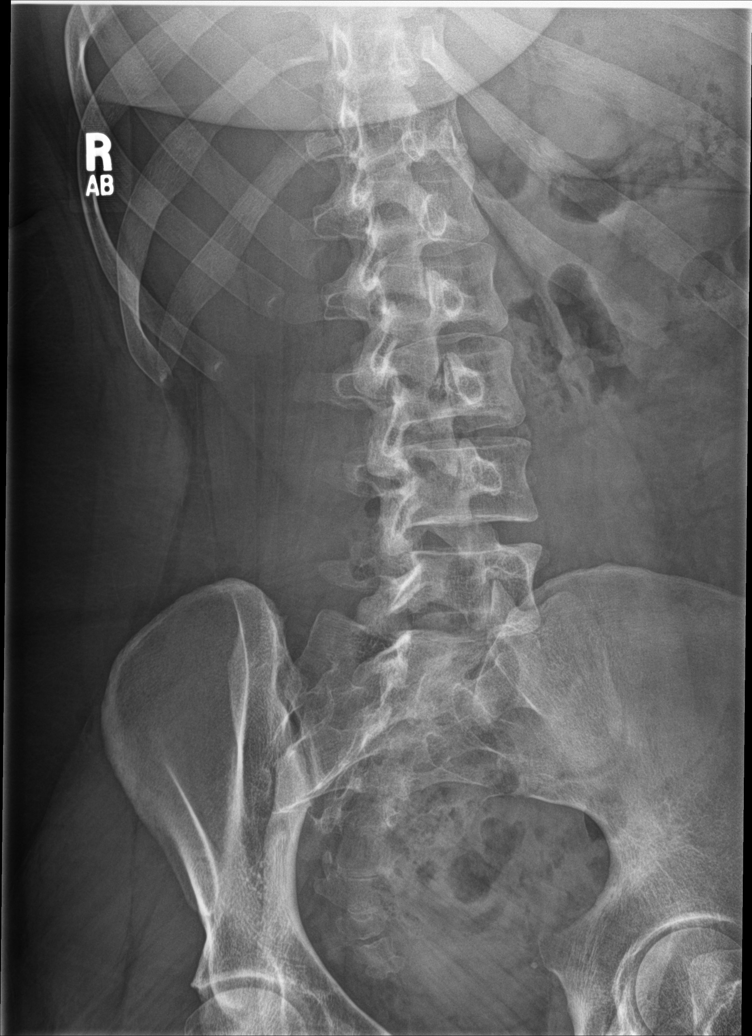

[l-spine lat]
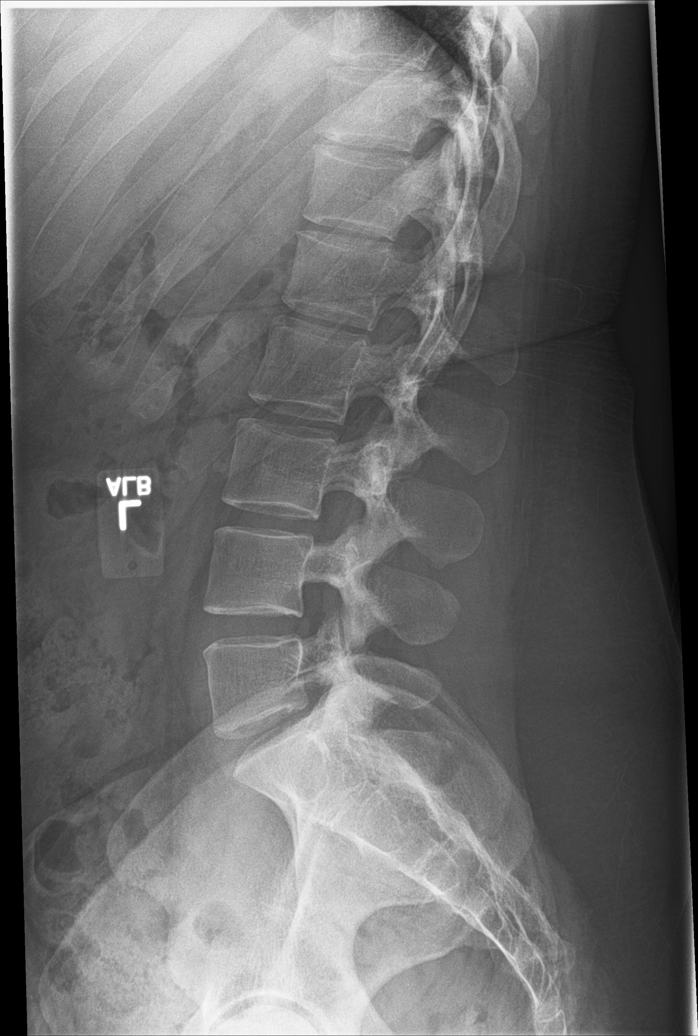

[l-spine spot]
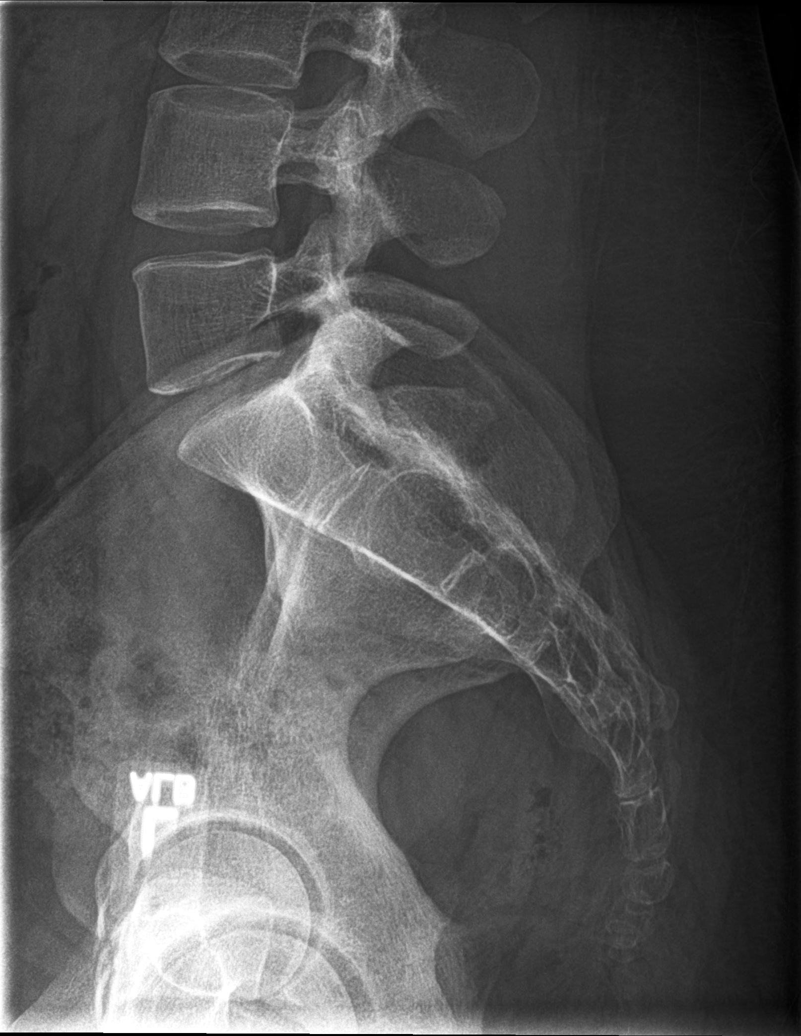

[5 of 5 positions shown; findings below may reference images not displayed]

FINDINGS: Paraspinal soft tissues are normal. No acute bony abnormality
identified. Normal alignment and mineralization.
IMPRESSION: Negative exam .

## 2018-02-23 ENCOUNTER — Encounter: Payer: Self-pay | Admitting: Women's Health

## 2018-02-23 ENCOUNTER — Ambulatory Visit (INDEPENDENT_AMBULATORY_CARE_PROVIDER_SITE_OTHER): Payer: 59 | Admitting: Women's Health

## 2018-02-23 VITALS — BP 124/80 | Wt 240.0 lb

## 2018-02-23 DIAGNOSIS — Z01419 Encounter for gynecological examination (general) (routine) without abnormal findings: Secondary | ICD-10-CM

## 2018-02-23 NOTE — Patient Instructions (Signed)
Health Maintenance, Female Adopting a healthy lifestyle and getting preventive care can go a long way to promote health and wellness. Talk with your health care provider about what schedule of regular examinations is right for you. This is a good chance for you to check in with your provider about disease prevention and staying healthy. In between checkups, there are plenty of things you can do on your own. Experts have done a lot of research about which lifestyle changes and preventive measures are most likely to keep you healthy. Ask your health care provider for more information. Weight and diet Eat a healthy diet  Be sure to include plenty of vegetables, fruits, low-fat dairy products, and lean protein.  Do not eat a lot of foods high in solid fats, added sugars, or salt.  Get regular exercise. This is one of the most important things you can do for your health. ? Most adults should exercise for at least 150 minutes each week. The exercise should increase your heart rate and make you sweat (moderate-intensity exercise). ? Most adults should also do strengthening exercises at least twice a week. This is in addition to the moderate-intensity exercise.  Maintain a healthy weight  Body mass index (BMI) is a measurement that can be used to identify possible weight problems. It estimates body fat based on height and weight. Your health care provider can help determine your BMI and help you achieve or maintain a healthy weight.  For females 20 years of age and older: ? A BMI below 18.5 is considered underweight. ? A BMI of 18.5 to 24.9 is normal. ? A BMI of 25 to 29.9 is considered overweight. ? A BMI of 30 and above is considered obese.  Watch levels of cholesterol and blood lipids  You should start having your blood tested for lipids and cholesterol at 26 years of age, then have this test every 5 years.  You may need to have your cholesterol levels checked more often if: ? Your lipid or  cholesterol levels are high. ? You are older than 26 years of age. ? You are at high risk for heart disease.  Cancer screening Lung Cancer  Lung cancer screening is recommended for adults 55-80 years old who are at high risk for lung cancer because of a history of smoking.  A yearly low-dose CT scan of the lungs is recommended for people who: ? Currently smoke. ? Have quit within the past 15 years. ? Have at least a 30-pack-year history of smoking. A pack year is smoking an average of one pack of cigarettes a day for 1 year.  Yearly screening should continue until it has been 15 years since you quit.  Yearly screening should stop if you develop a health problem that would prevent you from having lung cancer treatment.  Breast Cancer  Practice breast self-awareness. This means understanding how your breasts normally appear and feel.  It also means doing regular breast self-exams. Let your health care provider know about any changes, no matter how small.  If you are in your 20s or 30s, you should have a clinical breast exam (CBE) by a health care provider every 1-3 years as part of a regular health exam.  If you are 40 or older, have a CBE every year. Also consider having a breast X-ray (mammogram) every year.  If you have a family history of breast cancer, talk to your health care provider about genetic screening.  If you are at high risk   for breast cancer, talk to your health care provider about having an MRI and a mammogram every year.  Breast cancer gene (BRCA) assessment is recommended for women who have family members with BRCA-related cancers. BRCA-related cancers include: ? Breast. ? Ovarian. ? Tubal. ? Peritoneal cancers.  Results of the assessment will determine the need for genetic counseling and BRCA1 and BRCA2 testing.  Cervical Cancer Your health care provider may recommend that you be screened regularly for cancer of the pelvic organs (ovaries, uterus, and  vagina). This screening involves a pelvic examination, including checking for microscopic changes to the surface of your cervix (Pap test). You may be encouraged to have this screening done every 3 years, beginning at age 22.  For women ages 56-65, health care providers may recommend pelvic exams and Pap testing every 3 years, or they may recommend the Pap and pelvic exam, combined with testing for human papilloma virus (HPV), every 5 years. Some types of HPV increase your risk of cervical cancer. Testing for HPV may also be done on women of any age with unclear Pap test results.  Other health care providers may not recommend any screening for nonpregnant women who are considered low risk for pelvic cancer and who do not have symptoms. Ask your health care provider if a screening pelvic exam is right for you.  If you have had past treatment for cervical cancer or a condition that could lead to cancer, you need Pap tests and screening for cancer for at least 20 years after your treatment. If Pap tests have been discontinued, your risk factors (such as having a new sexual partner) need to be reassessed to determine if screening should resume. Some women have medical problems that increase the chance of getting cervical cancer. In these cases, your health care provider may recommend more frequent screening and Pap tests.  Colorectal Cancer  This type of cancer can be detected and often prevented.  Routine colorectal cancer screening usually begins at 26 years of age and continues through 26 years of age.  Your health care provider may recommend screening at an earlier age if you have risk factors for colon cancer.  Your health care provider may also recommend using home test kits to check for hidden blood in the stool.  A small camera at the end of a tube can be used to examine your colon directly (sigmoidoscopy or colonoscopy). This is done to check for the earliest forms of colorectal  cancer.  Routine screening usually begins at age 33.  Direct examination of the colon should be repeated every 5-10 years through 26 years of age. However, you may need to be screened more often if early forms of precancerous polyps or small growths are found.  Skin Cancer  Check your skin from head to toe regularly.  Tell your health care provider about any new moles or changes in moles, especially if there is a change in a mole's shape or color.  Also tell your health care provider if you have a mole that is larger than the size of a pencil eraser.  Always use sunscreen. Apply sunscreen liberally and repeatedly throughout the day.  Protect yourself by wearing long sleeves, pants, a wide-brimmed hat, and sunglasses whenever you are outside.  Heart disease, diabetes, and high blood pressure  High blood pressure causes heart disease and increases the risk of stroke. High blood pressure is more likely to develop in: ? People who have blood pressure in the high end of  the normal range (130-139/85-89 mm Hg). ? People who are overweight or obese. ? People who are African American.  If you are 21-29 years of age, have your blood pressure checked every 3-5 years. If you are 3 years of age or older, have your blood pressure checked every year. You should have your blood pressure measured twice-once when you are at a hospital or clinic, and once when you are not at a hospital or clinic. Record the average of the two measurements. To check your blood pressure when you are not at a hospital or clinic, you can use: ? An automated blood pressure machine at a pharmacy. ? A home blood pressure monitor.  If you are between 17 years and 37 years old, ask your health care provider if you should take aspirin to prevent strokes.  Have regular diabetes screenings. This involves taking a blood sample to check your fasting blood sugar level. ? If you are at a normal weight and have a low risk for diabetes,  have this test once every three years after 26 years of age. ? If you are overweight and have a high risk for diabetes, consider being tested at a younger age or more often. Preventing infection Hepatitis B  If you have a higher risk for hepatitis B, you should be screened for this virus. You are considered at high risk for hepatitis B if: ? You were born in a country where hepatitis B is common. Ask your health care provider which countries are considered high risk. ? Your parents were born in a high-risk country, and you have not been immunized against hepatitis B (hepatitis B vaccine). ? You have HIV or AIDS. ? You use needles to inject street drugs. ? You live with someone who has hepatitis B. ? You have had sex with someone who has hepatitis B. ? You get hemodialysis treatment. ? You take certain medicines for conditions, including cancer, organ transplantation, and autoimmune conditions.  Hepatitis C  Blood testing is recommended for: ? Everyone born from [birth year] through 1964. ? Anyone with known risk factors for hepatitis C.  Sexually transmitted infections (STIs)  You should be screened for sexually transmitted infections (STIs) including gonorrhea and chlamydia if: ? You are sexually active and are younger than 26 years of age. ? You are older than 26 years of age and your health care provider tells you that you are at risk for this type of infection. ? Your sexual activity has changed since you were last screened and you are at an increased risk for chlamydia or gonorrhea. Ask your health care provider if you are at risk.  If you do not have HIV, but are at risk, it may be recommended that you take a prescription medicine daily to prevent HIV infection. This is called pre-exposure prophylaxis (PrEP). You are considered at risk if: ? You are sexually active and do not regularly use condoms or know the HIV status of your partner(s). ? You take drugs by injection. ? You are  sexually active with a partner who has HIV.  Talk with your health care provider about whether you are at high risk of being infected with HIV. If you choose to begin PrEP, you should first be tested for HIV. You should then be tested every 3 months for as long as you are taking PrEP. Pregnancy  If you are premenopausal and you may become pregnant, ask your health care provider about preconception counseling.  If you may become  pregnant, take 400 to 800 micrograms (mcg) of folic acid every day.  If you want to prevent pregnancy, talk to your health care provider about birth control (contraception). Osteoporosis and menopause  Osteoporosis is a disease in which the bones lose minerals and strength with aging. This can result in serious bone fractures. Your risk for osteoporosis can be identified using a bone density scan.  If you are 65 years of age or older, or if you are at risk for osteoporosis and fractures, ask your health care provider if you should be screened.  Ask your health care provider whether you should take a calcium or vitamin D supplement to lower your risk for osteoporosis.  Menopause may have certain physical symptoms and risks.  Hormone replacement therapy may reduce some of these symptoms and risks. Talk to your health care provider about whether hormone replacement therapy is right for you. Follow these instructions at home:  Schedule regular health, dental, and eye exams.  Stay current with your immunizations.  Do not use any tobacco products including cigarettes, chewing tobacco, or electronic cigarettes.  If you are pregnant, do not drink alcohol.  If you are breastfeeding, limit how much and how often you drink alcohol.  Limit alcohol intake to no more than 1 drink per day for nonpregnant women. One drink equals 12 ounces of beer, 5 ounces of wine, or 1 ounces of hard liquor.  Do not use street drugs.  Do not share needles.  Ask your health care  provider for help if you need support or information about quitting drugs.  Tell your health care provider if you often feel depressed.  Tell your health care provider if you have ever been abused or do not feel safe at home. This information is not intended to replace advice given to you by your health care provider. Make sure you discuss any questions you have with your health care provider. Document Released: 06/02/2011 Document Revised: 04/24/2016 Document Reviewed: 08/21/2015 Elsevier Interactive Patient Education  2018 [employer] Carbohydrate Counting for Diabetes Mellitus, Adult Carbohydrate counting is a method for keeping track of how many carbohydrates you eat. Eating carbohydrates naturally increases the amount of sugar (glucose) in the blood. Counting how many carbohydrates you eat helps keep your blood glucose within normal limits, which helps you manage your diabetes (diabetes mellitus). It is important to know how many carbohydrates you can safely have in each meal. This is different for every person. A diet and nutrition specialist (registered dietitian) can help you make a meal plan and calculate how many carbohydrates you should have at each meal and snack. Carbohydrates are found in the following foods:  Grains, such as breads and cereals.  Dried beans and soy products.  Starchy vegetables, such as potatoes, peas, and corn.  Fruit and fruit juices.  Milk and yogurt.  Sweets and snack foods, such as cake, cookies, candy, chips, and soft drinks.  How do I count carbohydrates? There are two ways to count carbohydrates in food. You can use either of the methods or a combination of both. Reading "Nutrition Facts" on packaged food The "Nutrition Facts" list is included on the labels of almost all packaged foods and beverages in the [place] It includes:  The serving size.  Information about nutrients in each serving, including the grams (g) of carbohydrate per  serving.  To use the "Nutrition Facts":  Decide how many servings you will have.  Multiply the number of servings by the number of carbohydrates   per serving.  The resulting number is the total amount of carbohydrates that you will be having.  Learning standard serving sizes of other foods When you eat foods containing carbohydrates that are not packaged or do not include "Nutrition Facts" on the label, you need to measure the servings in order to count the amount of carbohydrates:  Measure the foods that you will eat with a food scale or measuring cup, if needed.  Decide how many standard-size servings you will eat.  Multiply the number of servings by 15. Most carbohydrate-rich foods have about 15 g of carbohydrates per serving. ? For example, if you eat 8 oz (170 g) of strawberries, you will have eaten 2 servings and 30 g of carbohydrates (2 servings x 15 g = 30 g).  For foods that have more than one food mixed, such as soups and casseroles, you must count the carbohydrates in each food that is included.  The following list contains standard serving sizes of common carbohydrate-rich foods. Each of these servings has about 15 g of carbohydrates:   hamburger bun or  English muffin.   oz (15 mL) syrup.   oz (14 g) jelly.  1 slice of bread.  1 six-inch tortilla.  3 oz (85 g) cooked rice or pasta.  4 oz (113 g) cooked dried beans.  4 oz (113 g) starchy vegetable, such as peas, corn, or potatoes.  4 oz (113 g) hot cereal.  4 oz (113 g) mashed potatoes or  of a large baked potato.  4 oz (113 g) canned or frozen fruit.  4 oz (120 mL) fruit juice.  4-6 crackers.  6 chicken nuggets.  6 oz (170 g) unsweetened dry cereal.  6 oz (170 g) plain fat-free yogurt or yogurt sweetened with artificial sweeteners.  8 oz (240 mL) milk.  8 oz (170 g) fresh fruit or one small piece of fruit.  24 oz (680 g) popped popcorn.  Example of carbohydrate counting Sample meal  3  oz (85 g) chicken breast.  6 oz (170 g) brown rice.  4 oz (113 g) corn.  8 oz (240 mL) milk.  8 oz (170 g) strawberries with sugar-free whipped topping. Carbohydrate calculation 1. Identify the foods that contain carbohydrates: ? Rice. ? Corn. ? Milk. ? Strawberries. 2. Calculate how many servings you have of each food: ? 2 servings rice. ? 1 serving corn. ? 1 serving milk. ? 1 serving strawberries. 3. Multiply each number of servings by 15 g: ? 2 servings rice x 15 g = 30 g. ? 1 serving corn x 15 g = 15 g. ? 1 serving milk x 15 g = 15 g. ? 1 serving strawberries x 15 g = 15 g. 4. Add together all of the amounts to find the total grams of carbohydrates eaten: ? 30 g + 15 g + 15 g + 15 g = 75 g of carbohydrates total. This information is not intended to replace advice given to you by your health care provider. Make sure you discuss any questions you have with your health care provider. Document Released: 11/17/2005 Document Revised: 06/06/2016 Document Reviewed: 04/30/2016 Elsevier Interactive Patient Education  [employer].

## 2018-02-23 NOTE — Progress Notes (Signed)
Michele Burnett 07/08/1991 [id number]    History:    Presents for annual exam.  01/2016 Mirena IUD with rare spotting.  Normal Pap history.  Rubella positive.  Gardasil series completed.  Past medical history, past surgical history, family history and social history were all reviewed and documented in the EPIC chart.  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  Mother hypertension.  ROS:  A ROS was performed and pertinent positives and negatives are included.  Exam:  Vitals:   02/23/18 1554  BP: 124/80  Weight: 240 lb (108.9 kg)   Body mass index is 41.2 kg/m.   General appearance:  Normal Thyroid:  Symmetrical, normal in size, without palpable masses or nodularity. Respiratory  Auscultation:  Clear without wheezing or rhonchi Cardiovascular  Auscultation:  Regular rate, without rubs, murmurs or gallops  Edema/varicosities:  Not grossly evident Abdominal  Soft,nontender, without masses, guarding or rebound.  Liver/spleen:  No organomegaly noted  Hernia:  None appreciated  Skin  Inspection:  Grossly normal   Breasts: Examined lying and sitting.     Right: Without masses, retractions, discharge or axillary adenopathy.     Left: Without masses, retractions, discharge or axillary adenopathy. Gentitourinary   Inguinal/mons:  Normal without inguinal adenopathy  External genitalia:  Normal  BUS/Urethra/Skene's glands:  Normal  Vagina:  Normal  Cervix:  Normal IUD strings in os  uterus:   normal in size, shape and contour.  Midline and mobile  Adnexa/parametria:     Rt: Without masses or tenderness.   Lt: Without masses or tenderness.  Anus and perineum: Normal    Assessment/Plan:  27 y.o. MWF G0 for annual exam with no complaints.  01/2016 Mirena IUD rare spotting Obesity  Plan: Reviewed importance of increasing exercise and decreasing calories/carbs for wt. loss.  SBE's, calcium rich foods, MVI daily encouraged.  CBC, glucose, Pap normal 2018, new screening guidelines  reviewed.    Harrington Challengerancy J Young Sierra Vista HospitalWHNP, 4:18 PM 02/23/2018

## 2018-02-25 LAB — HEMOGLOBIN A1C
EAG (MMOL/L): 5.8 (calc)
Hgb A1c MFr Bld: 5.3 % of total Hgb (ref <5.7)
Mean Plasma Glucose: 105 (calc)

## 2018-02-25 LAB — CBC WITH DIFFERENTIAL/PLATELET
BASOS ABS: 97 {cells}/uL (ref 0–200)
Basophils Relative: 0.9 %
EOS ABS: 302 {cells}/uL (ref 15–500)
EOS PCT: 2.8 %
HCT: 38.6 % (ref 35.0–45.0)
Hemoglobin: 13.3 g/dL (ref 11.7–15.5)
Lymphs Abs: 4115 cells/uL — ABNORMAL HIGH (ref 850–3900)
MCH: 28.7 pg (ref 27.0–33.0)
MCHC: 34.5 g/dL (ref 32.0–36.0)
MCV: 83.2 fL (ref 80.0–100.0)
MONOS PCT: 7.3 %
MPV: 9.5 fL (ref 7.5–12.5)
NEUTROS PCT: 50.9 %
Neutro Abs: 5497 cells/uL (ref 1500–7800)
PLATELETS: 343 10*3/uL (ref 140–400)
RBC: 4.64 10*6/uL (ref 3.80–5.10)
RDW: 12.5 % (ref 11.0–15.0)
TOTAL LYMPHOCYTE: 38.1 %
WBC mixed population: 788 cells/uL (ref 200–950)
WBC: 10.8 10*3/uL (ref 3.8–10.8)

## 2018-02-25 LAB — URINALYSIS, COMPLETE W/RFL CULTURE
BACTERIA UA: NONE SEEN /HPF
Bilirubin Urine: NEGATIVE
Glucose, UA: NEGATIVE
Hgb urine dipstick: NEGATIVE
Hyaline Cast: NONE SEEN /LPF
KETONES UR: NEGATIVE
LEUKOCYTE ESTERASE: NEGATIVE
Nitrites, Initial: NEGATIVE
PROTEIN: NEGATIVE
RBC / HPF: NONE SEEN /HPF (ref 0–2)
SPECIFIC GRAVITY, URINE: 1.011 (ref 1.001–1.03)
SQUAMOUS EPITHELIAL / LPF: NONE SEEN /HPF (ref <=5)
WBC, UA: NONE SEEN /HPF (ref 0–5)
pH: 6.5 (ref 5.0–8.0)

## 2018-02-25 LAB — TEST AUTHORIZATION

## 2018-02-25 LAB — URINE CULTURE
MICRO NUMBER: 90376279
Result:: NO GROWTH
SPECIMEN QUALITY: ADEQUATE

## 2018-02-25 LAB — NO CULTURE INDICATED

## 2018-02-25 LAB — GLUCOSE, RANDOM: GLUCOSE: 106 mg/dL — AB (ref 65–99)

## 2018-10-04 DIAGNOSIS — M238X2 Other internal derangements of left knee: Secondary | ICD-10-CM | POA: Diagnosis not present

## 2019-01-26 ENCOUNTER — Ambulatory Visit: Payer: Self-pay | Admitting: Family Medicine

## 2019-01-31 ENCOUNTER — Ambulatory Visit (INDEPENDENT_AMBULATORY_CARE_PROVIDER_SITE_OTHER): Payer: BLUE CROSS/BLUE SHIELD | Admitting: Family Medicine

## 2019-01-31 ENCOUNTER — Encounter: Payer: Self-pay | Admitting: Family Medicine

## 2019-01-31 VITALS — BP 98/62 | HR 72 | Temp 98.6°F | Ht 64.75 in | Wt 240.0 lb

## 2019-01-31 DIAGNOSIS — T753XXA Motion sickness, initial encounter: Secondary | ICD-10-CM

## 2019-01-31 DIAGNOSIS — J358 Other chronic diseases of tonsils and adenoids: Secondary | ICD-10-CM | POA: Diagnosis not present

## 2019-01-31 DIAGNOSIS — Z6841 Body Mass Index (BMI) 40.0 and over, adult: Secondary | ICD-10-CM | POA: Diagnosis not present

## 2019-01-31 MED ORDER — SCOPOLAMINE 1 MG/3DAYS TD PT72: 1.0000 | MEDICATED_PATCH | TRANSDERMAL | 0 refills | Status: AC

## 2019-01-31 NOTE — Progress Notes (Signed)
   Subjective:     Michele Burnett is a 28 y.o. female presenting for Establish Care (previous PCP with Dr. Burnett Sheng.) and traveling (discuss getting medicaiton for motion sickness. Going on a cruise for 9 days.)     HPI  #motion sickness - going on a cruise for 9 days - got a little motion sickness on a previous cruise - did not kick in until the last day  Review of Systems Well, no concerns  Social History   Tobacco Use  Smoking Status Never Smoker  Smokeless Tobacco Never Used        Objective:    BP Readings from Last 3 Encounters:  01/31/19 98/62  02/23/18 124/80  02/19/17 110/78   Wt Readings from Last 3 Encounters:  01/31/19 240 lb (108.9 kg)  02/23/18 240 lb (108.9 kg)  02/19/17 229 lb (103.9 kg)    BP 98/62   Pulse 72   Temp 98.6 F (37 C)   Ht 5' 4.75" (1.645 m)   Wt 240 lb (108.9 kg)   BMI 40.25 kg/m    Physical Exam Constitutional:      General: She is not in acute distress.    Appearance: She is well-developed. She is not diaphoretic.  HENT:     Right Ear: External ear normal.     Left Ear: External ear normal.     Nose: Nose normal.  Eyes:     Conjunctiva/sclera: Conjunctivae normal.  Neck:     Musculoskeletal: Neck supple.  Cardiovascular:     Rate and Rhythm: Normal rate and regular rhythm.     Heart sounds: No murmur.  Pulmonary:     Effort: Pulmonary effort is normal. No respiratory distress.     Breath sounds: Normal breath sounds. No wheezing.  Skin:    General: Skin is warm and dry.     Capillary Refill: Capillary refill takes less than 2 seconds.  Neurological:     Mental Status: She is alert. Mental status is at baseline.  Psychiatric:        Mood and Affect: Mood normal.        Behavior: Behavior normal.           Assessment & Plan:   Problem List Items Addressed This Visit      Respiratory   Tonsil stone    Recurrent stones. Discussed that we could do an ENT referral if wanting to know if she would qualify  for surgery. Not interested at this time        Other   Morbid obesity with BMI of 40.0-44.9, adult (HCC)    Already working on diet. Discussed adding regular exercise routine       Other Visit Diagnoses    Motion sickness, initial encounter    -  Primary   Relevant Medications   scopolamine (TRANSDERM-SCOP) 1 MG/3DAYS       Return if symptoms worsen or fail to improve.  Lynnda Child, MD

## 2019-01-31 NOTE — Patient Instructions (Signed)
#  motion sickness - can start the day before the cruise or wait until you start having symptoms  #Weight loss/general health - make sustainable changes - Great job with diet so far - try to add exercise -- even walking the dog at a pace of 2.6 mph x 30 minutes 5 days a week would be great

## 2019-01-31 NOTE — Assessment & Plan Note (Signed)
Already working on diet. Discussed adding regular exercise routine

## 2019-01-31 NOTE — Assessment & Plan Note (Signed)
Recurrent stones. Discussed that we could do an ENT referral if wanting to know if she would qualify for surgery. Not interested at this time

## 2019-02-23 ENCOUNTER — Other Ambulatory Visit: Payer: Self-pay

## 2019-02-23 ENCOUNTER — Telehealth (INDEPENDENT_AMBULATORY_CARE_PROVIDER_SITE_OTHER): Payer: BLUE CROSS/BLUE SHIELD | Admitting: Family Medicine

## 2019-02-23 DIAGNOSIS — H6982 Other specified disorders of Eustachian tube, left ear: Secondary | ICD-10-CM | POA: Diagnosis not present

## 2019-02-23 NOTE — Patient Instructions (Signed)
#  Eustacian Tube dysfunction - Issue with pressure in the ear - likely have some fluid behind the ear   How to treat: 1) Chew gum 2) try to "pop" your ears -- close mouth, hold nose and blow 3) Can also try decongestant - like Pseudoephedrine 4) Can also try Flonase (nasal spray)  Symptoms should get better in about 7 days  If you develop fevers or worsening symptoms -- call back, may need an appointment to be evaluated for ear infection.

## 2019-02-23 NOTE — Progress Notes (Signed)
Virtual Visit via Telephone Note  I connected with Michele Burnett on 02/23/19 at 10:00 AM EDT by telephone and verified that I am speaking with the correct person using two identifiers.   I discussed the limitations, risks, security and privacy concerns of performing an evaluation and management service by telephone and the availability of in person appointments. I also discussed with the patient that there may be a patient responsible charge related to this service. The patient expressed understanding and agreed to proceed.  Patient location: Home Provider Location: [place] Participants: Lynnda Child and Michele Burnett   History of Present Illness: #Ear pain - left sided ear pain - feels like there is fluid in the ears - if the TV is on or video chatting has a odd sound - started yesterday evening - 4/10 on pain scale - worse last night - No fever, congestion, cough, runny nose, sneezing - treatment: has not tried anything - not sure what to do - no itching or rash on the ear - endorse some fullness and popping   Observations/Objective: Speaking in complete sentences No distress  Assessment and Plan: Problem List Items Addressed This Visit    None    Visit Diagnoses    Dysfunction of left eustachian tube    -  Primary     New symptoms and no fever, less likely otitis media.  Symptomatic care   Follow Up Instructions: If worsening symptoms or no improvement. May need in person visit to look at ear    I discussed the assessment and treatment plan with the patient. The patient was provided an opportunity to ask questions and all were answered. The patient agreed with the plan and demonstrated an understanding of the instructions.   The patient was advised to call back or seek an in-person evaluation if the symptoms worsen or if the condition fails to improve as anticipated.  I provided 5 minutes of non-face-to-face time during this encounter.   Lynnda Child, MD

## 2019-03-01 ENCOUNTER — Encounter: Payer: 59 | Admitting: Women's Health

## 2019-03-03 ENCOUNTER — Other Ambulatory Visit: Payer: Self-pay

## 2019-03-07 ENCOUNTER — Encounter: Payer: Self-pay | Admitting: Women's Health

## 2019-03-07 ENCOUNTER — Other Ambulatory Visit: Payer: Self-pay

## 2019-03-07 ENCOUNTER — Ambulatory Visit (INDEPENDENT_AMBULATORY_CARE_PROVIDER_SITE_OTHER): Payer: BLUE CROSS/BLUE SHIELD | Admitting: Women's Health

## 2019-03-07 VITALS — BP 124/78 | Ht 64.5 in | Wt 238.0 lb

## 2019-03-07 DIAGNOSIS — Z01419 Encounter for gynecological examination (general) (routine) without abnormal findings: Secondary | ICD-10-CM

## 2019-03-07 NOTE — Patient Instructions (Addendum)
Health Maintenance, Female Adopting a healthy lifestyle and getting preventive care can go a long way to promote health and wellness. Talk with your health care provider about what schedule of regular examinations is right for you. This is a good chance for you to check in with your provider about disease prevention and staying healthy. In between checkups, there are plenty of things you can do on your own. Experts have done a lot of research about which lifestyle changes and preventive measures are most likely to keep you healthy. Ask your health care provider for more information. Weight and diet Eat a healthy diet  Be sure to include plenty of vegetables, fruits, low-fat dairy products, and lean protein.  Do not eat a lot of foods high in solid fats, added sugars, or salt.  Get regular exercise. This is one of the most important things you can do for your health. ? Most adults should exercise for at least 150 minutes each week. The exercise should increase your heart rate and make you sweat (moderate-intensity exercise). ? Most adults should also do strengthening exercises at least twice a week. This is in addition to the moderate-intensity exercise. Maintain a healthy weight  Body mass index (BMI) is a measurement that can be used to identify possible weight problems. It estimates body fat based on height and weight. Your health care provider can help determine your BMI and help you achieve or maintain a healthy weight.  For females 20 years of age and older: ? A BMI below 18.5 is considered underweight. ? A BMI of 18.5 to 24.9 is normal. ? A BMI of 25 to 29.9 is considered overweight. ? A BMI of 30 and above is considered obese. Watch levels of cholesterol and blood lipids  You should start having your blood tested for lipids and cholesterol at 27 years of age, then have this test every 5 years.  You may need to have your cholesterol levels checked more often if: ? Your lipid or  cholesterol levels are high. ? You are older than 27 years of age. ? You are at high risk for heart disease. Cancer screening Lung Cancer  Lung cancer screening is recommended for adults 55-80 years old who are at high risk for lung cancer because of a history of smoking.  A yearly low-dose CT scan of the lungs is recommended for people who: ? Currently smoke. ? Have quit within the past 15 years. ? Have at least a 30-pack-year history of smoking. A pack year is smoking an average of one pack of cigarettes a day for 1 year.  Yearly screening should continue until it has been 15 years since you quit.  Yearly screening should stop if you develop a health problem that would prevent you from having lung cancer treatment. Breast Cancer  Practice breast self-awareness. This means understanding how your breasts normally appear and feel.  It also means doing regular breast self-exams. Let your health care provider know about any changes, no matter how small.  If you are in your 20s or 30s, you should have a clinical breast exam (CBE) by a health care provider every 1-3 years as part of a regular health exam.  If you are 40 or older, have a CBE every year. Also consider having a breast X-ray (mammogram) every year.  If you have a family history of breast cancer, talk to your health care provider about genetic screening.  If you are at high risk for breast cancer, talk   to your health care provider about having an MRI and a mammogram every year.  Breast cancer gene (BRCA) assessment is recommended for women who have family members with BRCA-related cancers. BRCA-related cancers include: ? Breast. ? Ovarian. ? Tubal. ? Peritoneal cancers.  Results of the assessment will determine the need for genetic counseling and BRCA1 and BRCA2 testing. Cervical Cancer Your health care provider may recommend that you be screened regularly for cancer of the pelvic organs (ovaries, uterus, and vagina).  This screening involves a pelvic examination, including checking for microscopic changes to the surface of your cervix (Pap test). You may be encouraged to have this screening done every 3 years, beginning at age 21.  For women ages 30-65, health care providers may recommend pelvic exams and Pap testing every 3 years, or they may recommend the Pap and pelvic exam, combined with testing for human papilloma virus (HPV), every 5 years. Some types of HPV increase your risk of cervical cancer. Testing for HPV may also be done on women of any age with unclear Pap test results.  Other health care providers may not recommend any screening for nonpregnant women who are considered low risk for pelvic cancer and who do not have symptoms. Ask your health care provider if a screening pelvic exam is right for you.  If you have had past treatment for cervical cancer or a condition that could lead to cancer, you need Pap tests and screening for cancer for at least 20 years after your treatment. If Pap tests have been discontinued, your risk factors (such as having a new sexual partner) need to be reassessed to determine if screening should resume. Some women have medical problems that increase the chance of getting cervical cancer. In these cases, your health care provider may recommend more frequent screening and Pap tests. Colorectal Cancer  This type of cancer can be detected and often prevented.  Routine colorectal cancer screening usually begins at 27 years of age and continues through 27 years of age.  Your health care provider may recommend screening at an earlier age if you have risk factors for colon cancer.  Your health care provider may also recommend using home test kits to check for hidden blood in the stool.  A small camera at the end of a tube can be used to examine your colon directly (sigmoidoscopy or colonoscopy). This is done to check for the earliest forms of colorectal cancer.  Routine  screening usually begins at age 50.  Direct examination of the colon should be repeated every 5-10 years through 27 years of age. However, you may need to be screened more often if early forms of precancerous polyps or small growths are found. Skin Cancer  Check your skin from head to toe regularly.  Tell your health care provider about any new moles or changes in moles, especially if there is a change in a mole's shape or color.  Also tell your health care provider if you have a mole that is larger than the size of a pencil eraser.  Always use sunscreen. Apply sunscreen liberally and repeatedly throughout the day.  Protect yourself by wearing long sleeves, pants, a wide-brimmed hat, and sunglasses whenever you are outside. Heart disease, diabetes, and high blood pressure  High blood pressure causes heart disease and increases the risk of stroke. High blood pressure is more likely to develop in: ? People who have blood pressure in the high end of the normal range (130-139/85-89 mm Hg). ? People   who are overweight or obese. ? People who are African American.  If you are 84-22 years of age, have your blood pressure checked every 3-5 years. If you are 67 years of age or older, have your blood pressure checked every year. You should have your blood pressure measured twice-once when you are at a hospital or clinic, and once when you are not at a hospital or clinic. Record the average of the two measurements. To check your blood pressure when you are not at a hospital or clinic, you can use: ? An automated blood pressure machine at a pharmacy. ? A home blood pressure monitor.  If you are between 52 years and 3 years old, ask your health care provider if you should take aspirin to prevent strokes.  Have regular diabetes screenings. This involves taking a blood sample to check your fasting blood sugar level. ? If you are at a normal weight and have a low risk for diabetes, have this test once  every three years after 27 years of age. ? If you are overweight and have a high risk for diabetes, consider being tested at a younger age or more often. Preventing infection Hepatitis B  If you have a higher risk for hepatitis B, you should be screened for this virus. You are considered at high risk for hepatitis B if: ? You were born in a country where hepatitis B is common. Ask your health care provider which countries are considered high risk. ? Your parents were born in a high-risk country, and you have not been immunized against hepatitis B (hepatitis B vaccine). ? You have HIV or AIDS. ? You use needles to inject street drugs. ? You live with someone who has hepatitis B. ? You have had sex with someone who has hepatitis B. ? You get hemodialysis treatment. ? You take certain medicines for conditions, including cancer, organ transplantation, and autoimmune conditions. Hepatitis C  Blood testing is recommended for: ? Everyone born from [birth year] through 1964. ? Anyone with known risk factors for hepatitis C. Sexually transmitted infections (STIs)  You should be screened for sexually transmitted infections (STIs) including gonorrhea and chlamydia if: ? You are sexually active and are younger than 27 years of age. ? You are older than 27 years of age and your health care provider tells you that you are at risk for this type of infection. ? Your sexual activity has changed since you were last screened and you are at an increased risk for chlamydia or gonorrhea. Ask your health care provider if you are at risk.  If you do not have HIV, but are at risk, it may be recommended that you take a prescription medicine daily to prevent HIV infection. This is called pre-exposure prophylaxis (PrEP). You are considered at risk if: ? You are sexually active and do not regularly use condoms or know the HIV status of your partner(s). ? You take drugs by injection. ? You are sexually active with a partner  who has HIV. Talk with your health care provider about whether you are at high risk of being infected with HIV. If you choose to begin PrEP, you should first be tested for HIV. You should then be tested every 3 months for as long as you are taking PrEP. Pregnancy  If you are premenopausal and you may become pregnant, ask your health care provider about preconception counseling.  If you may become pregnant, take 400 to 800 micrograms (mcg) of folic acid every  day.  If you want to prevent pregnancy, talk to your health care provider about birth control (contraception). Osteoporosis and menopause  Osteoporosis is a disease in which the bones lose minerals and strength with aging. This can result in serious bone fractures. Your risk for osteoporosis can be identified using a bone density scan.  If you are 65 years of age or older, or if you are at risk for osteoporosis and fractures, ask your health care provider if you should be screened.  Ask your health care provider whether you should take a calcium or vitamin D supplement to lower your risk for osteoporosis.  Menopause may have certain physical symptoms and risks.  Hormone replacement therapy may reduce some of these symptoms and risks. Talk to your health care provider about whether hormone replacement therapy is right for you. Follow these instructions at home:  Schedule regular health, dental, and eye exams.  Stay current with your immunizations.  Do not use any tobacco products including cigarettes, chewing tobacco, or electronic cigarettes.  If you are pregnant, do not drink alcohol.  If you are breastfeeding, limit how much and how often you drink alcohol.  Limit alcohol intake to no more than 1 drink per day for nonpregnant women. One drink equals 12 ounces of beer, 5 ounces of wine, or 1 ounces of hard liquor.  Do not use street drugs.  Do not share needles.  Ask your health care provider for help if you need support  or information about quitting drugs.  Tell your health care provider if you often feel depressed.  Tell your health care provider if you have ever been abused or do not feel safe at home. This information is not intended to replace advice given to you by your health care provider. Make sure you discuss any questions you have with your health care provider. Document Released: 06/02/2011 Document Revised: 04/24/2016 Document Reviewed: 08/21/2015 Elsevier Interactive Patient Education  2019 [employer]  Carbohydrate Counting for Diabetes Mellitus, Adult  Carbohydrate counting is a method of keeping track of how many carbohydrates you eat. Eating carbohydrates naturally increases the amount of sugar (glucose) in the blood. Counting how many carbohydrates you eat helps keep your blood glucose within normal limits, which helps you manage your diabetes (diabetes mellitus). It is important to know how many carbohydrates you can safely have in each meal. This is different for every person. A diet and nutrition specialist (registered dietitian) can help you make a meal plan and calculate how many carbohydrates you should have at each meal and snack. Carbohydrates are found in the following foods:  Grains, such as breads and cereals.  Dried beans and soy products.  Starchy vegetables, such as potatoes, peas, and corn.  Fruit and fruit juices.  Milk and yogurt.  Sweets and snack foods, such as cake, cookies, candy, chips, and soft drinks. How do I count carbohydrates? There are two ways to count carbohydrates in food. You can use either of the methods or a combination of both. Reading "Nutrition Facts" on packaged food The "Nutrition Facts" list is included on the labels of almost all packaged foods and beverages in the [place] It includes:  The serving size.  Information about nutrients in each serving, including the grams (g) of carbohydrate per serving. To use the "Nutrition  Facts":  Decide how many servings you will have.  Multiply the number of servings by the number of carbohydrates per serving.  The resulting number is the total amount of   carbohydrates that you will be having. Learning standard serving sizes of other foods When you eat carbohydrate foods that are not packaged or do not include "Nutrition Facts" on the label, you need to measure the servings in order to count the amount of carbohydrates:  Measure the foods that you will eat with a food scale or measuring cup, if needed.  Decide how many standard-size servings you will eat.  Multiply the number of servings by 15. Most carbohydrate-rich foods have about 15 g of carbohydrates per serving. ? For example, if you eat 8 oz (170 g) of strawberries, you will have eaten 2 servings and 30 g of carbohydrates (2 servings x 15 g = 30 g).  For foods that have more than one food mixed, such as soups and casseroles, you must count the carbohydrates in each food that is included. The following list contains standard serving sizes of common carbohydrate-rich foods. Each of these servings has about 15 g of carbohydrates:   hamburger bun or  English muffin.   oz (15 mL) syrup.   oz (14 g) jelly.  1 slice of bread.  1 six-inch tortilla.  3 oz (85 g) cooked rice or pasta.  4 oz (113 g) cooked dried beans.  4 oz (113 g) starchy vegetable, such as peas, corn, or potatoes.  4 oz (113 g) hot cereal.  4 oz (113 g) mashed potatoes or  of a large baked potato.  4 oz (113 g) canned or frozen fruit.  4 oz (120 mL) fruit juice.  4-6 crackers.  6 chicken nuggets.  6 oz (170 g) unsweetened dry cereal.  6 oz (170 g) plain fat-free yogurt or yogurt sweetened with artificial sweeteners.  8 oz (240 mL) milk.  8 oz (170 g) fresh fruit or one small piece of fruit.  24 oz (680 g) popped popcorn. Example of carbohydrate counting Sample meal  3 oz (85 g) chicken breast.  6 oz (170 g) brown  rice.  4 oz (113 g) corn.  8 oz (240 mL) milk.  8 oz (170 g) strawberries with sugar-free whipped topping. Carbohydrate calculation 1. Identify the foods that contain carbohydrates: ? Rice. ? Corn. ? Milk. ? Strawberries. 2. Calculate how many servings you have of each food: ? 2 servings rice. ? 1 serving corn. ? 1 serving milk. ? 1 serving strawberries. 3. Multiply each number of servings by 15 g: ? 2 servings rice x 15 g = 30 g. ? 1 serving corn x 15 g = 15 g. ? 1 serving milk x 15 g = 15 g. ? 1 serving strawberries x 15 g = 15 g. 4. Add together all of the amounts to find the total grams of carbohydrates eaten: ? 30 g + 15 g + 15 g + 15 g = 75 g of carbohydrates total. Summary  Carbohydrate counting is a method of keeping track of how many carbohydrates you eat.  Eating carbohydrates naturally increases the amount of sugar (glucose) in the blood.  Counting how many carbohydrates you eat helps keep your blood glucose within normal limits, which helps you manage your diabetes.  A diet and nutrition specialist (registered dietitian) can help you make a meal plan and calculate how many carbohydrates you should have at each meal and snack. This information is not intended to replace advice given to you by your health care provider. Make sure you discuss any questions you have with your health care provider. Document Released: 11/17/2005 Document Revised: 05/27/2017   Document Reviewed: 04/30/2016 Elsevier Interactive Patient Education  [employer].

## 2019-03-07 NOTE — Progress Notes (Signed)
Michele Burnett [birth year] [id number]    History:    Presents for annual exam.  Rare spotting with Mirena IUD placed 01/2016.  Rubella positive.  Gardasil series completed.  Normal Pap history.  Past medical history, past surgical history, family history and social history were all reviewed and documented in the EPIC chart.  Works at a [employer] in [employer]/older [employer].  Mother hypertension.  ROS:  A ROS was performed and pertinent positives and negatives are included.  Exam:  Vitals:   03/07/19 1153  BP: 124/78  Weight: 238 lb (108 kg)  Height: 5' 4.5" (1.638 m)   Body mass index is 40.22 kg/m.   General appearance:  Normal Thyroid:  Symmetrical, normal in size, without palpable masses or nodularity. Respiratory  Auscultation:  Clear without wheezing or rhonchi Cardiovascular  Auscultation:  Regular rate, without rubs, murmurs or gallops  Edema/varicosities:  Not grossly evident Abdominal  Soft,nontender, without masses, guarding or rebound.  Liver/spleen:  No organomegaly noted  Hernia:  None appreciated  Skin  Inspection:  Grossly normal   Breasts: Examined lying and sitting.     Right: Without masses, retractions, discharge or axillary adenopathy.     Left: Without masses, retractions, discharge or axillary adenopathy. Gentitourinary   Inguinal/mons:  Normal without inguinal adenopathy  External genitalia:  Normal  BUS/Urethra/Skene's glands:  Normal  Vagina:  Normal  Cervix:  Normal IUD strings visible  Uterus:  normal in size, shape and contour.  Midline and mobile  Adnexa/parametria:     Rt: Without masses or tenderness.   Lt: Without masses or tenderness.  Anus and perineum: Normal   Assessment/Plan:  28 y.o. MWF G0 for annual exam with no complaints.    01/2016 Mirena IUD rare spotting Obesity  Plan: Reviewed importance of decreasing calorie/carbs and increasing regular cardio type exercise.  SBEs, calcium rich foods, MVI daily  encouraged.  CBC, glucose, Pap normal 2018, new screening guidelines reviewed.[name], 12:01 PM 03/07/2019

## 2019-03-08 LAB — CBC WITH DIFFERENTIAL/PLATELET
Absolute Monocytes: 670 cells/uL (ref 200–950)
Basophils Absolute: 80 cells/uL (ref 0–200)
Basophils Relative: 0.8 %
Eosinophils Absolute: 180 cells/uL (ref 15–500)
Eosinophils Relative: 1.8 %
HCT: 39.4 % (ref 35.0–45.0)
Hemoglobin: 13.2 g/dL (ref 11.7–15.5)
Lymphs Abs: 3170 cells/uL (ref 850–3900)
MCH: 28.3 pg (ref 27.0–33.0)
MCHC: 33.5 g/dL (ref 32.0–36.0)
MCV: 84.4 fL (ref 80.0–100.0)
MPV: 9.9 fL (ref 7.5–12.5)
Monocytes Relative: 6.7 %
Neutro Abs: 5900 cells/uL (ref 1500–7800)
Neutrophils Relative %: 59 %
Platelets: 352 10*3/uL (ref 140–400)
RBC: 4.67 10*6/uL (ref 3.80–5.10)
RDW: 12.2 % (ref 11.0–15.0)
Total Lymphocyte: 31.7 %
WBC: 10 10*3/uL (ref 3.8–10.8)

## 2019-03-08 LAB — GLUCOSE, RANDOM: Glucose, Bld: 79 mg/dL (ref 65–99)

## 2019-04-01 ENCOUNTER — Other Ambulatory Visit: Payer: Self-pay

## 2019-04-01 ENCOUNTER — Telehealth: Payer: Self-pay | Admitting: Podiatry

## 2019-04-01 ENCOUNTER — Encounter: Payer: Self-pay | Admitting: Podiatry

## 2019-04-01 ENCOUNTER — Ambulatory Visit (INDEPENDENT_AMBULATORY_CARE_PROVIDER_SITE_OTHER): Payer: BLUE CROSS/BLUE SHIELD | Admitting: Podiatry

## 2019-04-01 VITALS — BP 131/89 | HR 92 | Temp 97.5°F | Resp 16

## 2019-04-01 DIAGNOSIS — L600 Ingrowing nail: Secondary | ICD-10-CM

## 2019-04-01 DIAGNOSIS — M79676 Pain in unspecified toe(s): Secondary | ICD-10-CM

## 2019-04-01 NOTE — Progress Notes (Signed)
  Subjective:  Patient ID: Michele Burnett, female    DOB: May 28, 1991,  MRN: [id number]  Chief Complaint  Patient presents with  . Toe Pain    Hallux right - lateral border, tender x few months, had removed twice, but never with the "chemical"  . New Patient (Initial Visit)    28 y.o. female presents with the above complaint. Desires permanent removal of the ingrown toenail today.  Review of Systems: Negative except as noted in the HPI. Denies N/V/F/Ch.  Past Medical History:  Diagnosis Date  . Fracture 7-12   RIGHT FOOT  . History of chickenpox   . History of shingles   . Ovarian cyst rupture 12-26-11  . Sinus infection [id number]   ALSO DIAGNOSED TONSILLITIS  . Tonsil stone     Current Outpatient Medications:  .  levonorgestrel (MIRENA) 20 MCG/24HR IUD, 1 each by Intrauterine route once., Disp: , Rfl:   Social History   Tobacco Use  Smoking Status Never Smoker  Smokeless Tobacco Never Used    Allergies  Allergen Reactions  . Amoxicillin     Other reaction(s): Vomiting   Objective:   Vitals:   04/01/19 1043  BP: 131/89  Pulse: 92  Resp: 16  Temp: (!) 97.5 F (36.4 C)   There is no height or weight on file to calculate BMI. Constitutional Well developed. Well nourished.  Vascular Dorsalis pedis pulses palpable bilaterally. Posterior tibial pulses palpable bilaterally. Capillary refill normal to all digits.  No cyanosis or clubbing noted. Pedal hair growth normal.  Neurologic Normal speech. Oriented to person, place, and time. Epicritic sensation to light touch grossly present bilaterally.  Dermatologic Painful ingrowing nail at lateral nail borders of the hallux nail right. No other open wounds. No skin lesions.  Orthopedic: Normal joint ROM without pain or crepitus bilaterally. No visible deformities. No bony tenderness.   Radiographs: None Assessment:   1. Ingrown right big toenail   2. Pain around toenail    Plan:  Patient was evaluated and  treated and all questions answered.  Ingrown Nail, right -Patient elects to proceed with minor surgery to remove ingrown toenail removal today. Consent reviewed and signed by patient. -Ingrown nail excised. See procedure note. -Educated on post-procedure care including soaking. Written instructions provided and reviewed. -Patient to follow up in 2 weeks for nail check.  Procedure: Excision of Ingrown Toenail Location: Right 1st toe lateral nail borders. Anesthesia: Lidocaine 1% plain; 1.5 mL and Marcaine 0.5% plain; 1.5 mL, digital block. Skin Prep: Betadine. Dressing: Silvadene; telfa; dry, sterile, compression dressing. Technique: Following skin prep, the toe was exsanguinated and a tourniquet was secured at the base of the toe. The affected nail border was freed, split with a nail splitter, and excised. Chemical matrixectomy was then performed with phenol and irrigated out with alcohol. The tourniquet was then removed and sterile dressing applied. Disposition: Patient tolerated procedure well. Patient to return in 2 weeks for follow-up.   Return in about 2 weeks (around 04/15/2019) for Nail Check. n

## 2019-04-01 NOTE — Patient Instructions (Signed)

## 2019-04-01 NOTE — Telephone Encounter (Signed)
Please call Patient to set up fact time appt for 2 wk nail check per Dr Samuella Cota.

## 2019-04-14 ENCOUNTER — Telehealth (INDEPENDENT_AMBULATORY_CARE_PROVIDER_SITE_OTHER): Payer: BLUE CROSS/BLUE SHIELD | Admitting: Podiatry

## 2019-04-14 DIAGNOSIS — L600 Ingrowing nail: Secondary | ICD-10-CM | POA: Diagnosis not present

## 2019-04-14 DIAGNOSIS — M79676 Pain in unspecified toe(s): Secondary | ICD-10-CM

## 2019-04-14 NOTE — Progress Notes (Signed)
Virtual Visit via Video Note  I connected with Michele Burnett on 04/14/19 at 11:45 AM EDT by a video enabled telemedicine application and verified that I am speaking with the correct person using two identifiers.  Location: Patient: Home Provider: [facility].   I discussed the limitations of evaluation and management by telemedicine and the availability of in person appointments. The patient expressed understanding and agreed to proceed.  History of Present Illness: Denies pain. States she is still soaking as directed. Denies issues with the toe.  PMH and medications reviewed. No interval changes   Observations/Objective: Right great toe appears to be healing well. Denies warmth or local redness. Denies pain.  Assessment and Plan: S/p R ingrown toenail removal -Healing well, no pain or discomfort of the toenail.  Follow Up Instructions: F/u PRN.   I discussed the assessment and treatment plan with the patient. The patient was provided an opportunity to ask questions and all were answered. The patient agreed with the plan and demonstrated an understanding of the instructions.   The patient was advised to call back or seek an in-person evaluation if the symptoms worsen or if the condition fails to improve as anticipated.  I provided 5 minutes of non-face-to-face time during this encounter.   Park Liter, DPM

## 2019-08-22 ENCOUNTER — Encounter: Payer: Self-pay | Admitting: Gynecology

## 2019-11-04 ENCOUNTER — Ambulatory Visit (INDEPENDENT_AMBULATORY_CARE_PROVIDER_SITE_OTHER): Payer: BC Managed Care – PPO | Admitting: Family Medicine

## 2019-11-04 ENCOUNTER — Other Ambulatory Visit: Payer: Self-pay

## 2019-11-04 ENCOUNTER — Encounter: Payer: Self-pay | Admitting: Family Medicine

## 2019-11-04 VITALS — BP 110/72 | HR 78 | Temp 98.3°F | Ht 64.75 in | Wt 234.8 lb

## 2019-11-04 DIAGNOSIS — H6991 Unspecified Eustachian tube disorder, right ear: Secondary | ICD-10-CM

## 2019-11-04 MED ORDER — FLUTICASONE PROPIONATE 50 MCG/ACT NA SUSP
2.0000 | Freq: Every day | NASAL | 6 refills | Status: AC
Start: 2019-11-04 — End: ?

## 2019-11-04 NOTE — Progress Notes (Signed)
   Subjective:    Patient ID: Michele Burnett, female    DOB: [birth year], 28 y.o.   MRN: [id number]  HPI Chief Complaint  Patient presents with  . Ear Pain    Right   This is a 28 yo female who presents today with above cc. She had a sensation of fluid in her ear about 4 days ago. Took some Zyrtec with relief. Awoke this morning with right ear pain. No fever, chills, nasal congestion. No chest pain, cough, chest congestion. Has been seen for similar symptoms with left ear 3/20.   Past Medical History:  Diagnosis Date  . Fracture 7-12   RIGHT FOOT  . History of chickenpox   . History of shingles   . Ovarian cyst rupture 12-26-11  . Sinus infection [id number]   ALSO DIAGNOSED TONSILLITIS  . Tonsil stone    Past Surgical History:  Procedure Laterality Date  . INTRAUTERINE DEVICE INSERTION  02/19/2016   Mirena  . WISDOM TOOTH EXTRACTION     Family History  Problem Relation Age of Onset  . Hypertension Maternal Uncle   . Diabetes Maternal Grandmother   . Hypertension Maternal Grandmother   . Hypertension Maternal Grandfather   . Brain cancer Maternal Grandfather        with mets to lung, liver, kidney  . Miscarriages / [place] Mother    Social History   Tobacco Use  . Smoking status: Never Smoker  . Smokeless tobacco: Never Used  Substance Use Topics  . Alcohol use: Yes    Alcohol/week: 0.0 standard drinks    Comment: RARE-once a month  . Drug use: No      Review of Systems Per HPI    Objective:   Physical Exam Vitals signs reviewed.  Constitutional:      General: She is not in acute distress.    Appearance: Normal appearance. She is obese. She is not ill-appearing, toxic-appearing or diaphoretic.  HENT:     Head: Normocephalic and atraumatic.     Right Ear: Tympanic membrane, ear canal and external ear normal.     Left Ear: Ear canal and external ear normal. Tympanic membrane is retracted.  Eyes:     Conjunctiva/sclera: Conjunctivae normal.  Neck:   Musculoskeletal: Normal range of motion and neck supple. No neck rigidity or muscular tenderness.  Cardiovascular:     Rate and Rhythm: Normal rate.  Pulmonary:     Effort: Pulmonary effort is normal.  Lymphadenopathy:     Cervical: No cervical adenopathy.  Skin:    General: Skin is warm and dry.  Neurological:     Mental Status: She is alert.       BP 110/72   Pulse 78   Temp 98.3 F (36.8 C) (Temporal)   Ht 5' 4.75" (1.645 m)   Wt 234 lb 12 oz (106.5 kg)   SpO2 98%   BMI 39.37 kg/m  Wt Readings from Last 3 Encounters:  11/04/19 234 lb 12 oz (106.5 kg)  03/07/19 238 lb (108 kg)  01/31/19 240 lb (108.9 kg)       Assessment & Plan:  1. Disorder of right eustachian tube - Provided written and verbal information regarding diagnosis and treatment. - continue Zyrtec - RTC precautions reviewed - fluticasone (FLONASE) 50 MCG/ACT nasal spray; Place 2 sprays into both nostrils daily.  Dispense: 16 g; Refill: [place], FNP-BC  [facility], [place]  11/05/2019 12:01 AM

## 2019-11-04 NOTE — Patient Instructions (Signed)
Good to see you today  I have sent fluticasone nasal spray to your pharmacy, use two sprays in each nostril at bedtime for 2 weeks.    Eustachian Tube Dysfunction  Eustachian tube dysfunction refers to a condition in which a blockage develops in the narrow passage that connects the middle ear to the back of the nose (eustachian tube). The eustachian tube regulates air pressure in the middle ear by letting air move between the ear and nose. It also helps to drain fluid from the middle ear space. Eustachian tube dysfunction can affect one or both ears. When the eustachian tube does not function properly, air pressure, fluid, or both can build up in the middle ear. What are the causes? This condition occurs when the eustachian tube becomes blocked or cannot open normally. Common causes of this condition include:  Ear infections.  Colds and other infections that affect the nose, mouth, and throat (upper respiratory tract).  Allergies.  Irritation from cigarette smoke.  Irritation from stomach acid coming up into the esophagus (gastroesophageal reflux). The esophagus is the tube that carries food from the mouth to the stomach.  Sudden changes in air pressure, such as from descending in an airplane or scuba diving.  Abnormal growths in the nose or throat, such as: ? Growths that line the nose (nasal polyps). ? Abnormal growth of cells (tumors). ? Enlarged tissue at the back of the throat (adenoids). What increases the risk? You are more likely to develop this condition if:  You smoke.  You are overweight.  You are a child who has: ? Certain birth defects of the mouth, such as cleft palate. ? Large tonsils or adenoids. What are the signs or symptoms? Common symptoms of this condition include:  A feeling of fullness in the ear.  Ear pain.  Clicking or popping noises in the ear.  Ringing in the ear.  Hearing loss.  Loss of balance.  Dizziness. Symptoms may get worse when  the air pressure around you changes, such as when you travel to an area of high elevation, fly on an airplane, or go scuba diving. How is this diagnosed? This condition may be diagnosed based on:  Your symptoms.  A physical exam of your ears, nose, and throat.  Tests, such as those that measure: ? The movement of your eardrum (tympanogram). ? Your hearing (audiometry). How is this treated? Treatment depends on the cause and severity of your condition.  In mild cases, you may relieve your symptoms by moving air into your ears. This is called "popping the ears."  In more severe cases, or if you have symptoms of fluid in your ears, treatment may include: ? Medicines to relieve congestion (decongestants). ? Medicines that treat allergies (antihistamines). ? Nasal sprays or ear drops that contain medicines that reduce swelling (steroids). ? A procedure to drain the fluid in your eardrum (myringotomy). In this procedure, a small tube is placed in the eardrum to:  Drain the fluid.  Restore the air in the middle ear space. ? A procedure to insert a balloon device through the nose to inflate the opening of the eustachian tube (balloon dilation). Follow these instructions at home: Lifestyle  Do not do any of the following until your health care provider approves: ? Travel to high altitudes. ? Fly in airplanes. ? Work in a Pension scheme manager or room. ? Scuba dive.  Do not use any products that contain nicotine or tobacco, such as cigarettes and e-cigarettes. If you need  help quitting, ask your health care provider.  Keep your ears dry. Wear fitted earplugs during showering and bathing. Dry your ears completely after. General instructions  Take over-the-counter and prescription medicines only as told by your health care provider.  Use techniques to help pop your ears as recommended by your health care provider. These may include: ? Chewing gum. ? Yawning. ? Frequent, forceful  swallowing. ? Closing your mouth, holding your nose closed, and gently blowing as if you are trying to blow air out of your nose.  Keep all follow-up visits as told by your health care provider. This is important. Contact a health care provider if:  Your symptoms do not go away after treatment.  Your symptoms come back after treatment.  You are unable to pop your ears.  You have: ? A fever. ? Pain in your ear. ? Pain in your head or neck. ? Fluid draining from your ear.  Your hearing suddenly changes.  You become very dizzy.  You lose your balance. Summary  Eustachian tube dysfunction refers to a condition in which a blockage develops in the eustachian tube.  It can be caused by ear infections, allergies, inhaled irritants, or abnormal growths in the nose or throat.  Symptoms include ear pain, hearing loss, or ringing in the ears.  Mild cases are treated with maneuvers to unblock the ears, such as yawning or ear popping.  Severe cases are treated with medicines. Surgery may also be done (rare). This information is not intended to replace advice given to you by your health care provider. Make sure you discuss any questions you have with your health care provider. Document Released: 12/14/2015 Document Revised: 03/09/2018 Document Reviewed: 03/09/2018 Elsevier Patient Education  2020 [employer].

## 2019-11-05 ENCOUNTER — Encounter: Payer: Self-pay | Admitting: Family Medicine

## 2020-03-09 ENCOUNTER — Other Ambulatory Visit: Payer: Self-pay

## 2020-03-12 ENCOUNTER — Encounter: Payer: BLUE CROSS/BLUE SHIELD | Admitting: Women's Health

## 2020-04-02 ENCOUNTER — Other Ambulatory Visit: Payer: Self-pay

## 2020-04-03 ENCOUNTER — Encounter: Payer: Self-pay | Admitting: Nurse Practitioner

## 2020-04-03 ENCOUNTER — Ambulatory Visit (INDEPENDENT_AMBULATORY_CARE_PROVIDER_SITE_OTHER): Payer: BC Managed Care – PPO | Admitting: Nurse Practitioner

## 2020-04-03 VITALS — BP 122/74 | Ht 65.0 in | Wt 239.6 lb

## 2020-04-03 DIAGNOSIS — Z01419 Encounter for gynecological examination (general) (routine) without abnormal findings: Secondary | ICD-10-CM | POA: Diagnosis not present

## 2020-04-03 DIAGNOSIS — Z13220 Encounter for screening for lipoid disorders: Secondary | ICD-10-CM | POA: Diagnosis not present

## 2020-04-03 NOTE — Progress Notes (Signed)
   Michele Burnett 19-Apr-1991 [id number]   History:  29 y.o. G0 P0 presents for annual exam without GYN complaints.  Mirena placed March 2017, doing well on this, occasional spotting that only last a few hours.  Completed Gardasil series. Sexually active with husband, does not desire children at this time.  Gynecologic History No LMP recorded. (Menstrual status: IUD).   Contraception: IUD Last Pap: 02/19/2017 results were: Normal   Past medical history, past surgical history, family history and social history were all reviewed and documented in the EPIC chart.  ROS:  A ROS was performed and pertinent positives and negatives are included.  Exam:  Vitals:   04/03/20 1556  BP: 122/74  Weight: 239 lb 9.6 oz (108.7 kg)  Height: 5\' 5"  (1.651 m)   Body mass index is 39.87 kg/m.  General appearance:  Normal Thyroid:  Symmetrical, normal in size, without palpable masses or nodularity. Respiratory  Auscultation:  Clear without wheezing or rhonchi Cardiovascular  Auscultation:  Regular rate, without rubs, murmurs or gallops  Edema/varicosities:  Not grossly evident Abdominal  Soft,nontender, without masses, guarding or rebound.  Liver/spleen:  No organomegaly noted  Hernia:  None appreciated  Skin  Inspection:  Grossly normal   Breasts: Examined lying and sitting.   Right: Without masses, retractions, discharge or axillary adenopathy.   Left: Without masses, retractions, discharge or axillary adenopathy. Gentitourinary   Inguinal/mons:  Normal without inguinal adenopathy  External genitalia:  Normal  BUS/Urethra/Skene's glands:  Normal  Vagina:  Normal  Cervix:  Normal, IUD string visualized  Uterus:  Normal in size, shape and contour.  Midline and mobile  Adnexa/parametria:     Rt: Without masses or tenderness.   Lt: Without masses or tenderness.  Anus and perineum: Normal   Assessment/Plan:  29 y.o.  for annual exam.  Education provided on SBEs, importance of preventative  screenings, current guidelines, high calcium diet, regular exercise, and multivitamin daily.   Well female exam with routine gynecological exam - Plan: CBC with Differential/Platelet, Comprehensive metabolic panel, TSH, Pap IG w/ reflex to HPV when ASC-U, CANCELED: Glucose, random  Lipid screening - Plan: Lipid panel   Follow up in 1 year for annual     [facility], 4:15 PM 04/03/2020

## 2020-04-03 NOTE — Patient Instructions (Signed)
Health Maintenance, Female Adopting a healthy lifestyle and getting preventive care are important in promoting health and wellness. Ask your health care provider about:  The right schedule for you to have regular tests and exams.  Things you can do on your own to prevent diseases and keep yourself healthy. What should I know about diet, weight, and exercise? Eat a healthy diet   Eat a diet that includes plenty of vegetables, fruits, low-fat dairy products, and lean protein.  Do not eat a lot of foods that are high in solid fats, added sugars, or sodium. Maintain a healthy weight Body mass index (BMI) is used to identify weight problems. It estimates body fat based on height and weight. Your health care provider can help determine your BMI and help you achieve or maintain a healthy weight. Get regular exercise Get regular exercise. This is one of the most important things you can do for your health. Most adults should:  Exercise for at least 150 minutes each week. The exercise should increase your heart rate and make you sweat (moderate-intensity exercise).  Do strengthening exercises at least twice a week. This is in addition to the moderate-intensity exercise.  Spend less time sitting. Even light physical activity can be beneficial. Watch cholesterol and blood lipids Have your blood tested for lipids and cholesterol at 28 years of age, then have this test every 5 years. Have your cholesterol levels checked more often if:  Your lipid or cholesterol levels are high.  You are older than 28 years of age.  You are at high risk for heart disease. What should I know about cancer screening? Depending on your health history and family history, you may need to have cancer screening at various ages. This may include screening for:  Breast cancer.  Cervical cancer.  Colorectal cancer.  Skin cancer.  Lung cancer. What should I know about heart disease, diabetes, and high blood  pressure? Blood pressure and heart disease  High blood pressure causes heart disease and increases the risk of stroke. This is more likely to develop in people who have high blood pressure readings, are of African descent, or are overweight.  Have your blood pressure checked: ? Every 3-5 years if you are 18-39 years of age. ? Every year if you are 40 years old or older. Diabetes Have regular diabetes screenings. This checks your fasting blood sugar level. Have the screening done:  Once every three years after age 40 if you are at a normal weight and have a low risk for diabetes.  More often and at a younger age if you are overweight or have a high risk for diabetes. What should I know about preventing infection? Hepatitis B If you have a higher risk for hepatitis B, you should be screened for this virus. Talk with your health care provider to find out if you are at risk for hepatitis B infection. Hepatitis C Testing is recommended for:  Everyone born from 1945 through 1965.  Anyone with known risk factors for hepatitis C. Sexually transmitted infections (STIs)  Get screened for STIs, including gonorrhea and chlamydia, if: ? You are sexually active and are younger than 28 years of age. ? You are older than 28 years of age and your health care provider tells you that you are at risk for this type of infection. ? Your sexual activity has changed since you were last screened, and you are at increased risk for chlamydia or gonorrhea. Ask your health care provider if   you are at risk.  Ask your health care provider about whether you are at high risk for HIV. Your health care provider may recommend a prescription medicine to help prevent HIV infection. If you choose to take medicine to prevent HIV, you should first get tested for HIV. You should then be tested every 3 months for as long as you are taking the medicine. Pregnancy  If you are about to stop having your period (premenopausal) and  you may become pregnant, seek counseling before you get pregnant.  Take 400 to 800 micrograms (mcg) of folic acid every day if you become pregnant.  Ask for birth control (contraception) if you want to prevent pregnancy. Osteoporosis and menopause Osteoporosis is a disease in which the bones lose minerals and strength with aging. This can result in bone fractures. If you are 65 years old or older, or if you are at risk for osteoporosis and fractures, ask your health care provider if you should:  Be screened for bone loss.  Take a calcium or vitamin D supplement to lower your risk of fractures.  Be given hormone replacement therapy (HRT) to treat symptoms of menopause. Follow these instructions at home: Lifestyle  Do not use any products that contain nicotine or tobacco, such as cigarettes, e-cigarettes, and chewing tobacco. If you need help quitting, ask your health care provider.  Do not use street drugs.  Do not share needles.  Ask your health care provider for help if you need support or information about quitting drugs. Alcohol use  Do not drink alcohol if: ? Your health care provider tells you not to drink. ? You are pregnant, may be pregnant, or are planning to become pregnant.  If you drink alcohol: ? Limit how much you use to 0-1 drink a day. ? Limit intake if you are breastfeeding.  Be aware of how much alcohol is in your drink. In the [place] one drink equals one 12 oz bottle of beer (355 mL), one 5 oz glass of wine (148 mL), or one 1 oz glass of hard liquor (44 mL). General instructions  Schedule regular health, dental, and eye exams.  Stay current with your vaccines.  Tell your health care provider if: ? You often feel depressed. ? You have ever been abused or do not feel safe at home. Summary  Adopting a healthy lifestyle and getting preventive care are important in promoting health and wellness.  Follow your health care provider's instructions about healthy  diet, exercising, and getting tested or screened for diseases.  Follow your health care provider's instructions on monitoring your cholesterol and blood pressure. This information is not intended to replace advice given to you by your health care provider. Make sure you discuss any questions you have with your health care provider. Document Revised: 11/10/2018 Document Reviewed: 11/10/2018 Elsevier Patient Education  2020 [employer] Inc[employer]  

## 2020-04-04 LAB — TSH: TSH: 2.82 mIU/L

## 2020-04-04 LAB — PAP IG W/ RFLX HPV ASCU

## 2020-04-04 LAB — CBC WITH DIFFERENTIAL/PLATELET
Absolute Monocytes: 762 cells/uL (ref 200–950)
Basophils Absolute: 67 cells/uL (ref 0–200)
Basophils Relative: 0.6 %
Eosinophils Absolute: 246 cells/uL (ref 15–500)
Eosinophils Relative: 2.2 %
HCT: 40.7 % (ref 35.0–45.0)
Hemoglobin: 13.4 g/dL (ref 11.7–15.5)
Lymphs Abs: 3909 cells/uL — ABNORMAL HIGH (ref 850–3900)
MCH: 28.5 pg (ref 27.0–33.0)
MCHC: 32.9 g/dL (ref 32.0–36.0)
MCV: 86.4 fL (ref 80.0–100.0)
MPV: 9.3 fL (ref 7.5–12.5)
Monocytes Relative: 6.8 %
Neutro Abs: 6216 cells/uL (ref 1500–7800)
Neutrophils Relative %: 55.5 %
Platelets: 404 10*3/uL — ABNORMAL HIGH (ref 140–400)
RBC: 4.71 10*6/uL (ref 3.80–5.10)
RDW: 12.3 % (ref 11.0–15.0)
Total Lymphocyte: 34.9 %
WBC: 11.2 10*3/uL — ABNORMAL HIGH (ref 3.8–10.8)

## 2020-04-04 LAB — COMPREHENSIVE METABOLIC PANEL
AG Ratio: 1.6 (calc) (ref 1.0–2.5)
ALT: 13 U/L (ref 6–29)
AST: 12 U/L (ref 10–30)
Albumin: 4.3 g/dL (ref 3.6–5.1)
Alkaline phosphatase (APISO): 76 U/L (ref 31–125)
BUN: 11 mg/dL (ref 7–25)
CO2: 23 mmol/L (ref 20–32)
Calcium: 9.1 mg/dL (ref 8.6–10.2)
Chloride: 103 mmol/L (ref 98–110)
Creat: 0.65 mg/dL (ref 0.50–1.10)
Globulin: 2.7 g/dL (calc) (ref 1.9–3.7)
Glucose, Bld: 91 mg/dL (ref 65–99)
Potassium: 4.1 mmol/L (ref 3.5–5.3)
Sodium: 136 mmol/L (ref 135–146)
Total Bilirubin: 0.2 mg/dL (ref 0.2–1.2)
Total Protein: 7 g/dL (ref 6.1–8.1)

## 2020-04-04 LAB — LIPID PANEL
Cholesterol: 199 mg/dL (ref <200)
HDL: 43 mg/dL — ABNORMAL LOW (ref >=50)
LDL Cholesterol (Calc): 122 mg/dL (calc) — ABNORMAL HIGH
Non-HDL Cholesterol (Calc): 156 mg/dL (calc) — ABNORMAL HIGH (ref <130)
Total CHOL/HDL Ratio: 4.6 (calc) (ref <5.0)
Triglycerides: 220 mg/dL — ABNORMAL HIGH (ref <150)

## 2020-08-02 ENCOUNTER — Other Ambulatory Visit: Payer: BC Managed Care – PPO

## 2020-08-20 ENCOUNTER — Telehealth: Payer: Self-pay

## 2020-08-20 NOTE — Telephone Encounter (Signed)
Pt left v/m that she got 2nd covid vaccine on 08/19/20; pt has a bruise in arm pit that she noticed this morning.No known injury. Pt wants to know if she should be concerned. Unable to reach pt by phone.Do not know where she got vaccine.

## 2020-08-20 NOTE — Telephone Encounter (Signed)
Would recommend she continue to monitor.   Monitor for swelling of the arm or pain and recommend appointment if those occur. Or if palpitations, breathing difficulty, or chest pain

## 2020-08-20 NOTE — Telephone Encounter (Signed)
Pt returned call best number [phone]

## 2020-08-21 NOTE — Telephone Encounter (Signed)
Spoke to pt and she says the spot is in her armpit and very tender. I relayed info per Dr. Selena Batten. Pt says she will keep an eye on it.

## 2020-09-03 DIAGNOSIS — M79671 Pain in right foot: Secondary | ICD-10-CM | POA: Diagnosis not present

## 2021-02-12 ENCOUNTER — Other Ambulatory Visit: Payer: BC Managed Care – PPO

## 2021-02-15 ENCOUNTER — Other Ambulatory Visit: Payer: Self-pay

## 2021-02-18 ENCOUNTER — Encounter: Payer: BC Managed Care – PPO | Admitting: Family Medicine

## 2021-02-18 ENCOUNTER — Other Ambulatory Visit: Payer: Self-pay

## 2021-02-18 ENCOUNTER — Encounter: Payer: Self-pay | Admitting: Family Medicine

## 2021-02-18 ENCOUNTER — Ambulatory Visit (INDEPENDENT_AMBULATORY_CARE_PROVIDER_SITE_OTHER): Payer: BC Managed Care – PPO | Admitting: Family Medicine

## 2021-02-18 VITALS — BP 110/78 | HR 85 | Temp 98.0°F | Ht 64.0 in | Wt 244.5 lb

## 2021-02-18 DIAGNOSIS — Z0000 Encounter for general adult medical examination without abnormal findings: Secondary | ICD-10-CM

## 2021-02-18 NOTE — Patient Instructions (Signed)
Continue to work on healthy diet and weight loss.

## 2021-02-18 NOTE — Progress Notes (Signed)
Annual Exam   Chief Complaint:  Chief Complaint  Patient presents with  . Annual Exam    No concerns. Next PAP is scheduled for 5/22 at [facility]. Has not had one since 2018.    History of Present Illness:  [name] is a 30 y.o. G0P0 who LMP was No LMP recorded. (Menstrual status: IUD)., presents today for her annual examination.      Nutrition/Lifestyle Diet: stopped soda last month, is also starting to make homemade omelet instead of frozen breakfast Exercise: 2 weeks of walking the dogs 1.5 mile per day She does get adequate calcium and Vitamin D in her diet.  Social History   Tobacco Use  Smoking Status Never Smoker  Smokeless Tobacco Never Used   Social History   Substance and Sexual Activity  Alcohol Use Yes  . Alcohol/week: 0.0 standard drinks   Comment: RARE-once a month   Social History   Substance and Sexual Activity  Drug Use No     Safety The patient wears seatbelts: yes.     The patient feels safe at home and in their relationships: yes.  General Health Dentist in the last year: Yes Eye doctor: not applicable  Menstrual IUD with infrequent spotting  GYN She is single partner, contraception - IUD.    Cervical Cancer Screening (Age 23-65) Last Pap:  May 2021 Results were: no abnormalities with HPV not done  Family History of Breast Cancer: no Family History of Ovarian Cancer: no    Weight Wt Readings from Last 3 Encounters:  02/18/21 244 lb 8 oz (110.9 kg)  04/03/20 239 lb 9.6 oz (108.7 kg)  11/04/19 234 lb 12 oz (106.5 kg)   Patient has very high BMI  BMI Readings from Last 1 Encounters:  02/18/21 41.97 kg/m     Chronic disease screening Blood pressure monitoring:  BP Readings from Last 3 Encounters:  02/18/21 110/78  04/03/20 122/74  11/04/19 110/72     Lipid Monitoring: Indication for screening: age >12, obesity, diabetes, family hx, CV risk factors.  Lipid screening: Yes  Lab Results  Component  Value Date   CHOL 199 04/03/2020   HDL 43 (L) 04/03/2020   LDLCALC 122 (H) 04/03/2020   TRIG 220 (H) 04/03/2020   CHOLHDL 4.6 04/03/2020     Diabetes Screening: age >72, overweight, family hx, PCOS, hx of gestational diabetes, at risk ethnicity, elevated blood pressure >135/80.  Diabetes Screening screening: Yes  Lab Results  Component Value Date   HGBA1C 5.3 02/23/2018      Past Medical History:  Diagnosis Date  . Fracture 7-12   RIGHT FOOT  . History of chickenpox   . History of shingles   . Ovarian cyst rupture 12-26-11  . Sinus infection [id number]   ALSO DIAGNOSED TONSILLITIS  . Tonsil stone     Past Surgical History:  Procedure Laterality Date  . INTRAUTERINE DEVICE INSERTION  02/19/2016   Mirena  . WISDOM TOOTH EXTRACTION      Prior to Admission medications   Medication Sig Start Date End Date Taking? Authorizing Provider  fluticasone (FLONASE) 50 MCG/ACT nasal spray Place 2 sprays into both nostrils daily. Patient taking differently: Place 2 sprays into both nostrils as needed. 11/04/19  Yes Emi Belfast, FNP  levonorgestrel (MIRENA) 20 MCG/24HR IUD 1 each by Intrauterine route once.   Yes [provider]    Allergies  Allergen Reactions  . Amoxicillin     Other reaction(s): Vomiting  Gynecologic History: No LMP recorded. (Menstrual status: IUD).  Obstetric History: G0P0  Social History   Socioeconomic History  . Marital status: Married    Spouse name: Michele Burnett  . Number of children: Not on file  . Years of education: High school  . Highest education level: Not on file  Occupational History  . Not on file  Tobacco Use  . Smoking status: Never Smoker  . Smokeless tobacco: Never Used  Vaping Use  . Vaping Use: Never used  Substance and Sexual Activity  . Alcohol use: Yes    Alcohol/week: 0.0 standard drinks    Comment: RARE-once a month  . Drug use: No  . Sexual activity: Yes    Birth control/protection: I.U.D.    Comment:  Mirena IUD 02/19/2016 INTERCOURSE AGE 41, MORE THAN 5 SEXUAL PARTNERS  Other Topics Concern  . 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 Hypertension Maternal Uncle   . Diabetes Maternal Grandmother   . Hypertension Maternal Grandmother   . Hypertension Maternal Grandfather   . Brain cancer Maternal Grandfather        with mets to lung, liver, kidney  . Miscarriages / [place] Mother     Review of Systems  Constitutional: Negative for chills and fever.  HENT: Negative for congestion and sore throat.   Eyes: Negative for blurred vision and double vision.  Respiratory: Negative for shortness of breath.   Cardiovascular: Negative for chest pain.  Gastrointestinal: Negative for heartburn, nausea and vomiting.  Genitourinary: Negative.   Musculoskeletal: Negative.  Negative for myalgias.  Skin: Negative for rash.  Neurological: Negative for dizziness and headaches.  Endo/Heme/Allergies: Does not bruise/bleed easily.  Psychiatric/Behavioral: Negative for depression. The patient is not nervous/anxious.      Physical Exam BP 110/78   Pulse 85   Temp 98 F (36.7 C) (Temporal)   Ht 5\' 4"  (1.626 m)   Wt 244 lb 8 oz (110.9 kg)   SpO2 98%   BMI 41.97 kg/m    BP Readings from Last 3 Encounters:  02/18/21 110/78  04/03/20 122/74  11/04/19 110/72    Wt Readings from Last 3  Encounters:  02/18/21 244 lb 8 oz (110.9 kg)  04/03/20 239 lb 9.6 oz (108.7 kg)  11/04/19 234 lb 12 oz (106.5 kg)     Physical Exam Constitutional:      General: She is not in acute distress.    Appearance: She is well-developed. She is obese. She is not diaphoretic.  HENT:     Head: Normocephalic and atraumatic.     Right Ear: External ear normal.     Left Ear: External ear normal.     Nose: Nose normal.  Eyes:     General: No scleral icterus.    Conjunctiva/sclera: Conjunctivae normal.  Cardiovascular:     Rate and Rhythm: Normal rate and regular rhythm.     Heart sounds: No murmur heard.   Pulmonary:     Effort: Pulmonary effort is normal. No respiratory distress.     Breath sounds: Normal breath sounds. No wheezing.  Abdominal:     General: Bowel sounds are normal. There is no distension.     Palpations: Abdomen is soft. There is no mass.     Tenderness: There is no abdominal tenderness. There is no guarding or rebound.  Musculoskeletal:        General: Normal range of motion.     Cervical back: Neck supple.  Lymphadenopathy:     Cervical: No cervical adenopathy.  Skin:    General: Skin is warm and dry.     Capillary Refill: Capillary refill takes less than 2 seconds.  Neurological:     Mental Status: She is alert and oriented to person, place, and time.     Deep Tendon Reflexes: Reflexes normal.  Psychiatric:        Behavior: Behavior normal.       Results: Depression screen [facility] 2/9 02/18/2021 01/31/2019  Decreased Interest 0 0  Down, Depressed, Hopeless 0 0  PHQ - 2 Score 0 0  Altered sleeping - 0  Tired, decreased energy - 0  Change in appetite - 0  Feeling bad or failure about yourself  - 0  Trouble concentrating - 0  Moving slowly or fidgety/restless - 0  Suicidal thoughts - 0  PHQ-9 Score - 0  Difficult doing work/chores - Not difficult at all      Assessment: 30 y.o. G0P0 female here for routine annual examination.  Plan: Problem List  Items Addressed This Visit   None   Visit Diagnoses    Annual physical exam    -  Primary       Screening: -- Blood pressure screen normal -- cholesterol screening: not due for screening -- Weight screening: obese: discussed management options, including lifestyle, dietary, and exercise. -- Diabetes Screening: not due for screening -- Nutrition: encouraged healthy diet   Psych -- Depression screening (PHQ-9):  Flowsheet Row Office Visit from 01/31/2019 in [facility]  PHQ-9 Total Score 0       Safety -- tobacco screening: not using -- alcohol screening:  low-risk usage. -- no evidence of domestic violence or intimate partner violence.  Cancer Screening -- pap smear not collected per ASCCP guidelines -- family history of breast cancer screening: done. not at high risk.   Immunizations Immunization History  Administered Date(s) Administered  . HPV Quadrivalent 01/04/2008  . Influenza,inj,Quad PF,6+ Mos 08/31/2020  . Influenza-Unspecified 08/22/2015, 09/01/2019  . PFIZER Comirnaty(Gray Top)Covid-19 Tri-Sucrose Vaccine 07/29/2020, 08/19/2020  . PPD Test 08/24/2018    -- flu vaccine up to date -- TDAP q10 years not up to date - will defer for a future visit -- HPV vaccination series (Age <26, shared decision 27-45): received -- Covid-19 Vaccine up to date  Encouraged regular vision and dental screening. Encouraged healthy exercise and diet.   Lynnda Child

## 2021-04-04 ENCOUNTER — Encounter: Payer: BC Managed Care – PPO | Admitting: Nurse Practitioner

## 2021-04-08 ENCOUNTER — Ambulatory Visit (INDEPENDENT_AMBULATORY_CARE_PROVIDER_SITE_OTHER): Payer: BC Managed Care – PPO | Admitting: Nurse Practitioner

## 2021-04-08 ENCOUNTER — Encounter: Payer: Self-pay | Admitting: Nurse Practitioner

## 2021-04-08 ENCOUNTER — Other Ambulatory Visit: Payer: Self-pay

## 2021-04-08 VITALS — BP 120/76 | HR 90 | Resp 16 | Ht 64.0 in | Wt 241.0 lb

## 2021-04-08 DIAGNOSIS — Z30431 Encounter for routine checking of intrauterine contraceptive device: Secondary | ICD-10-CM | POA: Diagnosis not present

## 2021-04-08 DIAGNOSIS — Z01419 Encounter for gynecological examination (general) (routine) without abnormal findings: Secondary | ICD-10-CM | POA: Diagnosis not present

## 2021-04-08 NOTE — Patient Instructions (Signed)
Health Maintenance, Female Adopting a healthy lifestyle and getting preventive care are important in promoting health and wellness. Ask your health care provider about:  The right schedule for you to have regular tests and exams.  Things you can do on your own to prevent diseases and keep yourself healthy. What should I know about diet, weight, and exercise? Eat a healthy diet  Eat a diet that includes plenty of vegetables, fruits, low-fat dairy products, and lean protein.  Do not eat a lot of foods that are high in solid fats, added sugars, or sodium.   Maintain a healthy weight Body mass index (BMI) is used to identify weight problems. It estimates body fat based on height and weight. Your health care provider can help determine your BMI and help you achieve or maintain a healthy weight. Get regular exercise Get regular exercise. This is one of the most important things you can do for your health. Most adults should:  Exercise for at least 150 minutes each week. The exercise should increase your heart rate and make you sweat (moderate-intensity exercise).  Do strengthening exercises at least twice a week. This is in addition to the moderate-intensity exercise.  Spend less time sitting. Even light physical activity can be beneficial. Watch cholesterol and blood lipids Have your blood tested for lipids and cholesterol at 29 years of age, then have this test every 5 years. Have your cholesterol levels checked more often if:  Your lipid or cholesterol levels are high.  You are older than 29 years of age.  You are at high risk for heart disease. What should I know about cancer screening? Depending on your health history and family history, you may need to have cancer screening at various ages. This may include screening for:  Breast cancer.  Cervical cancer.  Colorectal cancer.  Skin cancer.  Lung cancer. What should I know about heart disease, diabetes, and high blood  pressure? Blood pressure and heart disease  High blood pressure causes heart disease and increases the risk of stroke. This is more likely to develop in people who have high blood pressure readings, are of African descent, or are overweight.  Have your blood pressure checked: ? Every 3-5 years if you are 18-39 years of age. ? Every year if you are 40 years old or older. Diabetes Have regular diabetes screenings. This checks your fasting blood sugar level. Have the screening done:  Once every three years after age 40 if you are at a normal weight and have a low risk for diabetes.  More often and at a younger age if you are overweight or have a high risk for diabetes. What should I know about preventing infection? Hepatitis B If you have a higher risk for hepatitis B, you should be screened for this virus. Talk with your health care provider to find out if you are at risk for hepatitis B infection. Hepatitis C Testing is recommended for:  Everyone born from 1945 through 1965.  Anyone with known risk factors for hepatitis C. Sexually transmitted infections (STIs)  Get screened for STIs, including gonorrhea and chlamydia, if: ? You are sexually active and are younger than 29 years of age. ? You are older than 29 years of age and your health care provider tells you that you are at risk for this type of infection. ? Your sexual activity has changed since you were last screened, and you are at increased risk for chlamydia or gonorrhea. Ask your health care provider   if you are at risk.  Ask your health care provider about whether you are at high risk for HIV. Your health care provider may recommend a prescription medicine to help prevent HIV infection. If you choose to take medicine to prevent HIV, you should first get tested for HIV. You should then be tested every 3 months for as long as you are taking the medicine. Pregnancy  If you are about to stop having your period (premenopausal) and  you may become pregnant, seek counseling before you get pregnant.  Take 400 to 800 micrograms (mcg) of folic acid every day if you become pregnant.  Ask for birth control (contraception) if you want to prevent pregnancy. Osteoporosis and menopause Osteoporosis is a disease in which the bones lose minerals and strength with aging. This can result in bone fractures. If you are 65 years old or older, or if you are at risk for osteoporosis and fractures, ask your health care provider if you should:  Be screened for bone loss.  Take a calcium or vitamin D supplement to lower your risk of fractures.  Be given hormone replacement therapy (HRT) to treat symptoms of menopause. Follow these instructions at home: Lifestyle  Do not use any products that contain nicotine or tobacco, such as cigarettes, e-cigarettes, and chewing tobacco. If you need help quitting, ask your health care provider.  Do not use street drugs.  Do not share needles.  Ask your health care provider for help if you need support or information about quitting drugs. Alcohol use  Do not drink alcohol if: ? Your health care provider tells you not to drink. ? You are pregnant, may be pregnant, or are planning to become pregnant.  If you drink alcohol: ? Limit how much you use to 0-1 drink a day. ? Limit intake if you are breastfeeding.  Be aware of how much alcohol is in your drink. In the [place] one drink equals one 12 oz bottle of beer (355 mL), one 5 oz glass of wine (148 mL), or one 1 oz glass of hard liquor (44 mL). General instructions  Schedule regular health, dental, and eye exams.  Stay current with your vaccines.  Tell your health care provider if: ? You often feel depressed. ? You have ever been abused or do not feel safe at home. Summary  Adopting a healthy lifestyle and getting preventive care are important in promoting health and wellness.  Follow your health care provider's instructions about healthy  diet, exercising, and getting tested or screened for diseases.  Follow your health care provider's instructions on monitoring your cholesterol and blood pressure. This information is not intended to replace advice given to you by your health care provider. Make sure you discuss any questions you have with your health care provider. Document Revised: 11/10/2018 Document Reviewed: 11/10/2018 Elsevier Patient Education  2021 Elsevier Inc.  

## 2021-04-08 NOTE — Progress Notes (Signed)
   Michele Burnett [birth year] [id number]   History:  30 y.o. G0 presents for annual exam without GYN complaints. Mirena IUD 01/2016. Normal pap history. Has received Gardasil series.   Gynecologic History No LMP recorded. (Menstrual status: IUD).   Contraception/Family planning: IUD  Health Maintenance Last Pap: 04/03/2020. Results were: normal Last mammogram: Not indicated Last colonoscopy: No indicated Last Dexa: Not indicated  Past medical history, past surgical history, family history and social history were all reviewed and documented in the EPIC chart. Married.   ROS:  A ROS was performed and pertinent positives and negatives are included.  Exam:  Vitals:   04/08/21 1526  BP: 120/76  Pulse: 90  Resp: 16  Weight: 241 lb (109.3 kg)  Height: 5\' 4"  (1.626 m)   Body mass index is 41.37 kg/m.  General appearance:  Normal Thyroid:  Symmetrical, normal in size, without palpable masses or nodularity. Respiratory  Auscultation:  Clear without wheezing or rhonchi Cardiovascular  Auscultation:  Regular rate, without rubs, murmurs or gallops  Edema/varicosities:  Not grossly evident Abdominal  Soft,nontender, without masses, guarding or rebound.  Liver/spleen:  No organomegaly noted  Hernia:  None appreciated  Skin  Inspection:  Grossly normal Breasts: Examined lying and sitting.   Right: Without masses, retractions, nipple discharge or axillary adenopathy.   Left: Without masses, retractions, nipple discharge or axillary adenopathy. Genitourinary   Inguinal/mons:  Normal without inguinal adenopathy  External genitalia:  Normal appearing vulva with no masses, tenderness, or lesions  BUS/Urethra/Skene's glands:  Normal  Vagina:  Normal appearing with normal color and discharge, no lesions  Cervix:  Normal appearing without discharge or lesions. IUD string visible  Uterus:  Normal in size, shape and contour.  Midline and mobile, nontender  Adnexa/parametria:     Rt: Normal  in size, without masses or tenderness.   Lt: Normal in size, without masses or tenderness.  Anus and perineum: Normal  Assessment/Plan:  30 y.o. G0 for annual exam.   Well female exam with routine gynecological exam - Education provided on SBEs, importance of preventative screenings, current guidelines, high calcium diet, regular exercise, and multivitamin daily. Recently established with PCP and plans to have labs done there.   Encounter for routine checking of intrauterine contraceptive device (IUD) - Mirena IUD inserted 01/2016 for pregnancy prevention. She is aware Mirena is FDA approved for  7 years. She has occasional spotting.  Screening for cervical cancer - normal pap history. Will repeat at 3-year interval per guidelines.   Return in 1 year for annual.    03/2016 DNP, 3:37 PM 04/08/2021

## 2021-09-26 ENCOUNTER — Telehealth (INDEPENDENT_AMBULATORY_CARE_PROVIDER_SITE_OTHER): Payer: BC Managed Care – PPO | Admitting: Family Medicine

## 2021-09-26 ENCOUNTER — Other Ambulatory Visit: Payer: Self-pay

## 2021-09-26 VITALS — Wt 244.0 lb

## 2021-09-26 DIAGNOSIS — F411 Generalized anxiety disorder: Secondary | ICD-10-CM | POA: Insufficient documentation

## 2021-09-26 DIAGNOSIS — F419 Anxiety disorder, unspecified: Secondary | ICD-10-CM | POA: Diagnosis not present

## 2021-09-26 MED ORDER — HYDROXYZINE HCL 50 MG PO TABS: 25.0000 mg | ORAL_TABLET | Freq: Three times a day (TID) | ORAL | 1 refills | Status: DC | PRN

## 2021-09-26 NOTE — Patient Instructions (Addendum)
Ashwagandha - 300 mg twice daily (take at least this amount)  -- It looks like they've also looked at doses as high as 4000 mg three times a day  Hydroxyzine  How to help anxiety - without medication.   1) Regular Exercise - walking, jogging, cycling, dancing, strength training --> Yoga has been shown in research to reduce depression and anxiety -- with even just one hour long session per week  2)  Begin a Mindfulness/Meditation practice -- this can take a little as 3 minutes and is helpful for all kinds of mood issues -- You can find resources in books -- Or you can download apps like  ---- Headspace App (which currently has free content called "Weathering the Storm") ---- Calm (which has a few free options)  ---- Insignt Timer ---- Stop, Breathe & Think  # With each of these Apps - you should decline the "start free trial" offer and as you search through the App should be able to access some of their free content. You can also chose to pay for the content if you find one that works well for you.   # Many of them also offer sleep specific content which may help with insomnia  3) Healthy Diet -- Avoid or decrease Caffeine -- Avoid or decrease Alcohol -- Drink plenty of water, have a balanced diet -- Avoid cigarettes and marijuana (as well as other recreational drugs)  4) Consider contacting a professional therapist  -- [facility] is one option. Call [phone] -- Or you can check out www.psychologytoday.com -- you can read bios of therapists and see if they accept insurance -- Check with your insurance to see if you have coverage and who may take your insurance

## 2021-09-26 NOTE — Progress Notes (Signed)
I connected with Michele Burnett on 09/26/21 at  9:20 AM EDT by video and verified that I am speaking with the correct person using two identifiers.   I discussed the limitations, risks, security and privacy concerns of performing an evaluation and management service by video and the availability of in person appointments. I also discussed with the patient that there may be a patient responsible charge related to this service. The patient expressed understanding and agreed to proceed.  Patient location: parked car Provider Location: [place] [facility] Participants: Michele Burnett and Michele Burnett   Subjective:     Michele Burnett is a 30 y.o. female presenting for Anxiety (X 1 month of having "episodes" multiple times a week. Feeling on edge, irritable and  emotional)     Anxiety     #anxiety - no major changes - mom is moving - but not far - nothing new - feeling on edge - irritable - more emotional - in the past has felt this way for a day about one time per month - for the last 2 months it is happening more frequently - does not have a therapist - has tried journal, meditating, relaxing techniques - having a hard time finding something to help - has cut out all caffeine - eating and drinking is fine - limits fried foods, lower carb - drinks plenty of water - exercise: not currently, but wasn't doing  Did have a mild depression a few years ago - and ended up never taking the medication    Review of Systems   Social History   Tobacco Use  Smoking Status Never  Smokeless Tobacco Never        Objective:   BP Readings from Last 3 Encounters:  04/08/21 120/76  02/18/21 110/78  04/03/20 122/74   Wt Readings from Last 3 Encounters:  09/26/21 244 lb (110.7 kg)  04/08/21 241 lb (109.3 kg)  02/18/21 244 lb 8 oz (110.9 kg)   Wt 244 lb (110.7 kg)   BMI 41.88 kg/m   Physical Exam Constitutional:      Appearance: Normal appearance. She is not  ill-appearing.  HENT:     Head: Normocephalic and atraumatic.     Right Ear: External ear normal.     Left Ear: External ear normal.  Eyes:     Conjunctiva/sclera: Conjunctivae normal.  Pulmonary:     Effort: Pulmonary effort is normal. No respiratory distress.  Neurological:     Mental Status: She is alert. Mental status is at baseline.  Psychiatric:        Mood and Affect: Mood normal.        Behavior: Behavior normal.        Thought Content: Thought content normal.        Judgment: Judgment normal.      GAD 7 : Generalized Anxiety Score 09/26/2021  Nervous, Anxious, on Edge 2  Control/stop worrying 2  Worry too much - different things 2  Trouble relaxing 3  Restless 2  Easily annoyed or irritable 2  Afraid - awful might happen 1  Total GAD 7 Score 14  Anxiety Difficulty Somewhat difficult     Depression screen [facility] 2/9 09/26/2021 02/18/2021 01/31/2019  Decreased Interest 0 0 0  Down, Depressed, Hopeless 0 0 0  PHQ - 2 Score 0 0 0  Altered sleeping - - 0  Tired, decreased energy - - 0  Change in appetite - - 0  Feeling  bad or failure about yourself  - - 0  Trouble concentrating - - 0  Moving slowly or fidgety/restless - - 0  Suicidal thoughts - - 0  PHQ-9 Score - - 0  Difficult doing work/chores - - Not difficult at all        Assessment & Plan:   Problem List Items Addressed This Visit       Other   Anxiety - Primary    GAD elevated but only 1-2 months of symptoms. Does not have money/time for therapy - though discussed this is the first line treatment. Discussed including exercise, mindfulness. Trial of hydroxyzine. Also discussed trial of ashwagandha to see if that will help. Return if no improvement in 2-3 months to consider SSRI.       Relevant Medications   hydrOXYzine (ATARAX/VISTARIL) 50 MG tablet     Return in about 2 months (around 11/26/2021) for anxiety if not improved.  Michele Child, MD

## 2021-09-26 NOTE — Assessment & Plan Note (Signed)
GAD elevated but only 1-2 months of symptoms. Does not have money/time for therapy - though discussed this is the first line treatment. Discussed including exercise, mindfulness. Trial of hydroxyzine. Also discussed trial of ashwagandha to see if that will help. Return if no improvement in 2-3 months to consider SSRI.

## 2021-11-14 ENCOUNTER — Ambulatory Visit (INDEPENDENT_AMBULATORY_CARE_PROVIDER_SITE_OTHER): Payer: BC Managed Care – PPO | Admitting: Family

## 2021-11-14 ENCOUNTER — Other Ambulatory Visit: Payer: Self-pay

## 2021-11-14 VITALS — BP 118/84 | HR 73 | Temp 97.0°F | Ht 64.5 in | Wt 255.5 lb

## 2021-11-14 DIAGNOSIS — F411 Generalized anxiety disorder: Secondary | ICD-10-CM | POA: Diagnosis not present

## 2021-11-14 MED ORDER — ESCITALOPRAM OXALATE 10 MG PO TABS: 10.0000 mg | ORAL_TABLET | Freq: Every day | ORAL | 1 refills | Status: DC

## 2021-11-14 NOTE — Assessment & Plan Note (Signed)
I instructed pt to start lexapro 1/2 tablet once daily for 1 week and then increase to a full tablet once daily on week two as tolerated.  We discussed common side effects such as nausea, drowsiness and weight gain.  Also discussed rare but serious side effect of suicidal ideation.  She is instructed to discontinue medication and go directly to ED if this occurs.  Pt verbalizes understanding.  Plan is to follow up in 30 days to evaluate progress.    

## 2021-11-14 NOTE — Patient Instructions (Signed)
Start lexapro 10 mg once daily for anxiety and depression. Start by taking 1/2 tablet by mouth once daily for about one week, then increase to 1 full tablet thereafter.   Taking the medicine as directed and not missing any doses is one of the best things you can do to treat your nxiety.  Here are some things to keep in mind:  Side effects (stomach upset, some increased anxiety) may happen before you notice a benefit.  These side effects typically go away over time. Changes to your dose of medicine or a change in medication all together is sometimes necessary Many people will notice an improvement within two weeks but the full effect of the medication can take up to 4-6 weeks Stopping the medication when you start feeling better often results in a return of symptoms. Most people need to be on medication at least 6-12 months If you start having thoughts of hurting yourself or others after starting this medicine, please call me immediately.     It was a pleasure seeing you today! Please do not hesitate to reach out with any questions and or concerns.  Regards,   Mort Sawyers FNP-C

## 2021-11-14 NOTE — Progress Notes (Signed)
Established Patient Office Visit  Subjective:  Patient ID: Michele Burnett, female    DOB: 1991-02-10  Age: 30 y.o. MRN: [id number]  CC:  Chief Complaint  Patient presents with   Medication Problem    Hydroxyzine makes her too groggy no matter what time of day she takes it and its not effective with her anxiety     HPI Michele Burnett is here today with c/o anxiety. She states it is started to affect her at work and home.   Anxiety: denies SI HI, has taken daily aswaghanda with no relief of symptoms, trying hydroxyzine but this just makes her groggy and then she falls sleep. Doesn't currently see a therapist. She has not tried any SSRI and or other RX medication for anxiety in the past.  PHQ9 SCORE ONLY 11/14/2021 09/26/2021 02/18/2021  PHQ-9 Total Score 8 0 0   GAD 7 : Generalized Anxiety Score 11/14/2021 09/26/2021  Nervous, Anxious, on Edge 2 2  Control/stop worrying 2 2  Worry too much - different things 2 2  Trouble relaxing 2 3  Restless 2 2  Easily annoyed or irritable 3 2  Afraid - awful might happen 1 1  Total GAD 7 Score 14 14  Anxiety Difficulty Very difficult Somewhat difficult        Past Medical History:  Diagnosis Date   Fracture 7-12   RIGHT FOOT   History of chickenpox    History of shingles    Ovarian cyst rupture 12-26-11   Sinus infection 012513   ALSO DIAGNOSED TONSILLITIS   Tonsil stone     Past Surgical History:  Procedure Laterality Date   INTRAUTERINE DEVICE INSERTION  02/19/2016   Mirena   WISDOM TOOTH EXTRACTION      Family History  Problem Relation Age of Onset   Hypertension Maternal Uncle    Diabetes Maternal Grandmother    Hypertension Maternal Grandmother    Hypertension Maternal Grandfather    Brain cancer Maternal Grandfather        with mets to lung, liver, kidney   Miscarriages / [place] Mother     Social History   Socioeconomic History   Marital status: Married    Spouse name: Molli Hazard   Number of children: Not  on file   Years of education: High school   Highest education level: Not on file  Occupational History   Not on file  Tobacco Use   Smoking status: Never   Smokeless tobacco: Never  Vaping Use   Vaping Use: Never used  Substance and Sexual Activity   Alcohol use: Yes    Comment: RARE-once a month   Drug use: Yes    Types: Cocaine   Sexual activity: Yes    Birth control/protection: I.U.D.    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   Married to [place] - known each other since high school   Pets: 2 dogs   Enjoys: hunting, racing, spend time with niece, camping   Exercise: not currently   Diet: cut back to 1 soda a day, hello fresh 2 nights a week for portion control   Social Determinants of Corporate investment banker Strain: Not on file  Food Insecurity: Not on file  Transportation Needs: Not on file  Physical Activity: Not on file  Stress: Not on file  Social Connections: Not on file  Intimate Partner Violence: Not on file    Outpatient Medications Prior to Visit  Medication Sig Dispense Refill   fluticasone (FLONASE) 50 MCG/ACT nasal spray Place 2 sprays into both nostrils daily. (Patient taking differently: Place 2 sprays into both nostrils as needed.) 16 g 6   hydrOXYzine (ATARAX/VISTARIL) 50 MG tablet Take 0.5-2 tablets (25-100 mg total) by mouth 3 (three) times daily as needed for anxiety. 30 tablet 1   levonorgestrel (MIRENA) 20 MCG/24HR IUD 1 each by Intrauterine route once.     No facility-administered medications prior to visit.    Allergies  Allergen Reactions   Amoxicillin     Other reaction(s): Vomiting    ROS Review of Systems  Respiratory:  Negative for shortness of breath.   Cardiovascular:  Negative for chest pain and palpitations.  Gastrointestinal:  Negative for constipation and diarrhea.   Genitourinary:  Negative for dysuria, frequency and urgency.  Musculoskeletal:  Negative for myalgias.  Psychiatric/Behavioral:  Negative for depression and suicidal ideas.   All other systems reviewed and are negative.    Objective:    Physical Exam  Gen: NAD, resting comfortably CV: RRR with no murmurs appreciated Psych: Normal affect and thought content  BP 118/84    Pulse 73    Temp (!) 97 F (36.1 C) (Temporal)    Ht 5' 4.5" (1.638 m)    Wt 255 lb 8 oz (115.9 kg)    SpO2 99%    BMI 43.18 kg/m  Wt Readings from Last 3 Encounters:  11/14/21 255 lb 8 oz (115.9 kg)  09/26/21 244 lb (110.7 kg)  04/08/21 241 lb (109.3 kg)     Health Maintenance Due  Topic Date Due   HIV Screening  Never done   Hepatitis C Screening  Never done   TETANUS/TDAP  Never done   PAP SMEAR-Modifier  02/20/2020   COVID-19 Vaccine (3 - Booster for Pfizer series) 10/14/2020    There are no preventive care reminders to display for this patient.  Lab Results  Component Value Date   TSH 2.82 04/03/2020   Lab Results  Component Value Date   WBC 11.2 (H) 04/03/2020   HGB 13.4 04/03/2020   HCT 40.7 04/03/2020   MCV 86.4 04/03/2020   PLT 404 (H) 04/03/2020   Lab Results  Component Value Date   NA 136 04/03/2020   K 4.1 04/03/2020   CO2 23 04/03/2020   GLUCOSE 91 04/03/2020   BUN 11 04/03/2020   CREATININE 0.65 04/03/2020   BILITOT 0.2 04/03/2020   AST 12 04/03/2020   ALT 13 04/03/2020   PROT 7.0 04/03/2020   CALCIUM 9.1 04/03/2020   ANIONGAP 10 12/26/2011   Lab Results  Component Value Date   CHOL 199 04/03/2020   Lab Results  Component Value Date   HDL 43 (L) 04/03/2020   Lab Results  Component Value Date   LDLCALC 122 (H) 04/03/2020   Lab Results  Component Value Date   TRIG 220 (H) 04/03/2020   Lab Results  Component Value Date   CHOLHDL 4.6 04/03/2020   Lab Results  Component Value Date   HGBA1C 5.3 02/23/2018      Assessment & Plan:   Problem List Items  Addressed This Visit       Other   Generalized anxiety disorder - Primary     I instructed pt to start lexapro 1/2 tablet once daily for 1 week and then increase to a full  tablet once daily on week two as tolerated.  We discussed common side effects such as nausea, drowsiness and weight gain.  Also discussed rare but serious side effect of suicidal ideation.  She is instructed to discontinue medication and go directly to ED if this occurs.  Pt verbalizes understanding.  Plan is to follow up in 30 days to evaluate progress.          Relevant Medications   escitalopram (LEXAPRO) 10 MG tablet    Meds ordered this encounter  Medications   escitalopram (LEXAPRO) 10 MG tablet    Sig: Take 1 tablet (10 mg total) by mouth daily.    Dispense:  30 tablet    Refill:  1    Order Specific Question:   Supervising Provider    Answer:   Ermalene Searing, AMY E [[id number]]    Follow-up: Return in about 30 days (around 12/14/2021).    Mort Sawyers, FNP

## 2021-12-16 ENCOUNTER — Encounter: Payer: Self-pay | Admitting: Family

## 2021-12-16 ENCOUNTER — Telehealth: Payer: BC Managed Care – PPO | Admitting: Family

## 2021-12-16 ENCOUNTER — Other Ambulatory Visit: Payer: Self-pay

## 2021-12-16 NOTE — Progress Notes (Deleted)
MyChart Video Visit    Virtual Visit via Video Note   This visit type was conducted due to national recommendations for restrictions regarding the COVID-19 Pandemic (e.g. social distancing) in an effort to limit this patient's exposure and mitigate transmission in our community. This patient is at least at moderate risk for complications without adequate follow up. This format is felt to be most appropriate for this patient at this time. Physical exam was limited by quality of the video and audio technology used for the visit. CMA was able to get the patient set up on a video visit.  Patient location: Home. Patient and provider in visit Provider location: Office  I discussed the limitations of evaluation and management by telemedicine and the availability of in person appointments. The patient expressed understanding and agreed to proceed.  Visit Date: 12/16/2021  Today's healthcare provider: Mort Sawyers, FNP     Subjective:    Patient ID: Michele Burnett, female    DOB: July 27, 1991, 31 y.o.   MRN: [id number]  No chief complaint on file.   HPI  Pt here today via video visit for follow up.   GAD: started lexapro 10 mg once weekly 11/14/21.   Past Medical History:  Diagnosis Date   Fracture 7-12   RIGHT FOOT   History of chickenpox    History of shingles    Ovarian cyst rupture 12-26-11   Sinus infection 012513   ALSO DIAGNOSED TONSILLITIS   Tonsil stone     Past Surgical History:  Procedure Laterality Date   INTRAUTERINE DEVICE INSERTION  02/19/2016   Mirena   WISDOM TOOTH EXTRACTION      Family History  Problem Relation Age of Onset   Hypertension Maternal Uncle    Diabetes Maternal Grandmother    Hypertension Maternal Grandmother    Hypertension Maternal Grandfather    Brain cancer Maternal Grandfather        with mets to lung, liver, kidney   Miscarriages / [place] Mother     Social History   Socioeconomic History   Marital status: Married     Spouse name: Molli Hazard   Number of children: Not on file   Years of education: High school   Highest education level: Not on file  Occupational History   Not on file  Tobacco Use   Smoking status: Never   Smokeless tobacco: Never  Vaping Use   Vaping Use: Never used  Substance and Sexual Activity   Alcohol use: Yes    Comment: RARE-once a month   Drug use: Yes    Types: Cocaine   Sexual activity: Yes    Birth control/protection: I.U.D.    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   Married to [place] - known each other since high school   Pets: 2 dogs   Enjoys: hunting, racing, spend time with niece, camping   Exercise: not currently   Diet: cut back to 1 soda a day, hello fresh 2 nights a week for portion control   Social Determinants of Corporate investment banker Strain: Not on file  Food Insecurity: Not on file  Transportation Needs: Not on file  Physical Activity: Not on file  Stress: Not on file  Social Connections: Not on file  Intimate Partner Violence: Not on file    Outpatient Medications Prior  to Visit  Medication Sig Dispense Refill   escitalopram (LEXAPRO) 10 MG tablet Take 1 tablet (10 mg total) by mouth daily. 30 tablet 1   fluticasone (FLONASE) 50 MCG/ACT nasal spray Place 2 sprays into both nostrils daily. (Patient taking differently: Place 2 sprays into both nostrils as needed.) 16 g 6   hydrOXYzine (ATARAX/VISTARIL) 50 MG tablet Take 0.5-2 tablets (25-100 mg total) by mouth 3 (three) times daily as needed for anxiety. 30 tablet 1   levonorgestrel (MIRENA) 20 MCG/24HR IUD 1 each by Intrauterine route once.     No facility-administered medications prior to visit.    Allergies  Allergen Reactions   Amoxicillin     Other reaction(s): Vomiting    Review of Systems     Objective:    Physical Exam  There  were no vitals taken for this visit. Wt Readings from Last 3 Encounters:  11/14/21 255 lb 8 oz (115.9 kg)  09/26/21 244 lb (110.7 kg)  04/08/21 241 lb (109.3 kg)       Assessment & Plan:   Problem List Items Addressed This Visit   None   I am having Michele Burnett maintain her levonorgestrel, fluticasone, hydrOXYzine, and escitalopram.  No orders of the defined types were placed in this encounter.   I discussed the assessment and treatment plan with the patient. The patient was provided an opportunity to ask questions and all were answered. The patient agreed with the plan and demonstrated an understanding of the instructions.   The patient was advised to call back or seek an in-person evaluation if the symptoms worsen or if the condition fails to improve as anticipated.  I provided 15 minutes of face-to-face time during this encounter.   Mort Sawyers, FNP [facility] [phone] (phone) [phone] (fax)  [facility]

## 2021-12-16 NOTE — Progress Notes (Deleted)
Established Patient Office Visit  Subjective:  Patient ID: Michele Burnett, female    DOB: Jan 15, 1991  Age: 31 y.o. MRN: [id number]  CC: No chief complaint on file.   HPI Michele Burnett is here today with c/o   Past Medical History:  Diagnosis Date   Fracture 7-12   RIGHT FOOT   History of chickenpox    History of shingles    Ovarian cyst rupture 12-26-11   Sinus infection 012513   ALSO DIAGNOSED TONSILLITIS   Tonsil stone     Past Surgical History:  Procedure Laterality Date   INTRAUTERINE DEVICE INSERTION  02/19/2016   Mirena   WISDOM TOOTH EXTRACTION      Family History  Problem Relation Age of Onset   Hypertension Maternal Uncle    Diabetes Maternal Grandmother    Hypertension Maternal Grandmother    Hypertension Maternal Grandfather    Brain cancer Maternal Grandfather        with mets to lung, liver, kidney   Miscarriages / [place] Mother     Social History   Socioeconomic History   Marital status: Married    Spouse name: Molli Hazard   Number of children: Not on file   Years of education: High school   Highest education level: Not on file  Occupational History   Not on file  Tobacco Use   Smoking status: Never   Smokeless tobacco: Never  Vaping Use   Vaping Use: Never used  Substance and Sexual Activity   Alcohol use: Yes    Comment: RARE-once a month   Drug use: Yes    Types: Cocaine   Sexual activity: Yes    Birth control/protection: I.U.D.    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   Married to [place] - known each other since high school   Pets: 2 dogs   Enjoys: hunting, racing, spend time with niece, camping   Exercise: not currently   Diet: cut back to 1 soda a day, hello fresh 2 nights a week for portion control   Social Determinants of Corporate investment banker Strain: Not on file  Food  Insecurity: Not on file  Transportation Needs: Not on file  Physical Activity: Not on file  Stress: Not on file  Social Connections: Not on file  Intimate Partner Violence: Not on file    Outpatient Medications Prior to Visit  Medication Sig Dispense Refill   escitalopram (LEXAPRO) 10 MG tablet Take 1 tablet (10 mg total) by mouth daily. 30 tablet 1   fluticasone (FLONASE) 50 MCG/ACT nasal spray Place 2 sprays into both nostrils daily. (Patient taking differently: Place 2 sprays into both nostrils as needed.) 16 g 6   hydrOXYzine (ATARAX/VISTARIL) 50 MG tablet Take 0.5-2 tablets (25-100 mg total) by mouth 3 (three) times daily as needed for anxiety. 30 tablet 1   levonorgestrel (MIRENA) 20 MCG/24HR IUD 1 each by Intrauterine route once.     No facility-administered medications prior to visit.    Allergies  Allergen Reactions   Amoxicillin     Other reaction(s): Vomiting    ROS Review of Systems    Objective:    Physical Exam  There were no vitals taken for this visit. Wt Readings from Last 3 Encounters:  11/14/21 255 lb 8 oz (115.9 kg)  09/26/21 244 lb (  110.7 kg)  04/08/21 241 lb (109.3 kg)     Health Maintenance Due  Topic Date Due   HIV Screening  Never done   Hepatitis C Screening  Never done   TETANUS/TDAP  Never done   PAP SMEAR-Modifier  02/20/2020   COVID-19 Vaccine (3 - Booster for Pfizer series) 10/14/2020    There are no preventive care reminders to display for this patient.  Lab Results  Component Value Date   TSH 2.82 04/03/2020   Lab Results  Component Value Date   WBC 11.2 (H) 04/03/2020   HGB 13.4 04/03/2020   HCT 40.7 04/03/2020   MCV 86.4 04/03/2020   PLT 404 (H) 04/03/2020   Lab Results  Component Value Date   NA 136 04/03/2020   K 4.1 04/03/2020   CO2 23 04/03/2020   GLUCOSE 91 04/03/2020   BUN 11 04/03/2020   CREATININE 0.65 04/03/2020   BILITOT 0.2 04/03/2020   AST 12 04/03/2020   ALT 13 04/03/2020   PROT 7.0 04/03/2020    CALCIUM 9.1 04/03/2020   ANIONGAP 10 12/26/2011   Lab Results  Component Value Date   HGBA1C 5.3 02/23/2018      Assessment & Plan:   Problem List Items Addressed This Visit   None   No orders of the defined types were placed in this encounter.   Follow-up: No follow-ups on file.    Mort Sawyers, FNP

## 2021-12-17 NOTE — Progress Notes (Signed)
Erroneous encounter Pt no showed video appt

## 2021-12-18 ENCOUNTER — Telehealth (INDEPENDENT_AMBULATORY_CARE_PROVIDER_SITE_OTHER): Payer: BC Managed Care – PPO | Admitting: Family

## 2021-12-18 ENCOUNTER — Other Ambulatory Visit: Payer: Self-pay

## 2021-12-18 ENCOUNTER — Encounter: Payer: Self-pay | Admitting: Family

## 2021-12-18 ENCOUNTER — Telehealth: Payer: Self-pay

## 2021-12-18 DIAGNOSIS — F411 Generalized anxiety disorder: Secondary | ICD-10-CM | POA: Diagnosis not present

## 2021-12-18 MED ORDER — ESCITALOPRAM OXALATE 10 MG PO TABS: 10.0000 mg | ORAL_TABLET | Freq: Every day | ORAL | 1 refills | Status: DC

## 2021-12-18 NOTE — Assessment & Plan Note (Signed)
Refill for lexapro 10 mg , one po qd sent to patient pharmacy. Pt has noted much improvement since starting medication. Continue with current medication. F/u in May/June for CPE and follow up. If SI HI stop med and let me know immediately and or seek emergency help.

## 2021-12-18 NOTE — Telephone Encounter (Signed)
Patient is requesting a transfer from Dr. Cody to Tabitha Dugal NP, do you approve? If so we will contact her to set up for a TOC" ° °

## 2021-12-18 NOTE — Patient Instructions (Signed)
Continue lexapro 10 mg , one po once daily.  Please reach out if any concerns.   It was a pleasure seeing you today! Please do not hesitate to reach out with any questions and or concerns.  Regards,   Eugenia Pancoast FNP-C

## 2021-12-18 NOTE — Progress Notes (Signed)
MyChart Video Visit    Virtual Visit via Video Note   This visit type was conducted due to national recommendations for restrictions regarding the COVID-19 Pandemic (e.g. social distancing) in an effort to limit this patient's exposure and mitigate transmission in our community. This patient is at least at moderate risk for complications without adequate follow up. This format is felt to be most appropriate for this patient at this time. Physical exam was limited by quality of the video and audio technology used for the visit. CMA was able to get the patient set up on a video visit.  Patient location: Home. Patient and provider in visit Provider location: Office  I discussed the limitations of evaluation and management by telemedicine and the availability of in person appointments. The patient expressed understanding and agreed to proceed.  Visit Date: 12/18/2021  Today's healthcare provider: Mort Sawyers, FNP     Subjective:    Patient ID: Michele Burnett, female    DOB: [birth year], 31 y.o.   MRN: [id number]  Chief Complaint  Patient presents with   medication check    HPI  Pt here via video visit   GAD: pt started taking lexapro 10 mg since last visit, and states it has worked so good with her. She no longer has panic/anxiety while driving on the highway, and she is not as agitated around her husband. She states she has really noticed quite a different. Tolerating well with no side effects. No SI / HI.   GAD 7 : Generalized Anxiety Score 12/18/2021 11/14/2021 09/26/2021  Nervous, Anxious, on Edge 0 2 2  Control/stop worrying 0 2 2  Worry too much - different things 0 2 2  Trouble relaxing 0 2 3  Restless 0 2 2  Easily annoyed or irritable 0 3 2  Afraid - awful might happen 0 1 1  Total GAD 7 Score 0 14 14  Anxiety Difficulty Not difficult at all Very difficult Somewhat difficult      Past Medical History:  Diagnosis Date   Fracture 7-12   RIGHT FOOT   History of  chickenpox    History of shingles    Ovarian cyst rupture 12-26-11   Sinus infection 012513   ALSO DIAGNOSED TONSILLITIS   Tonsil stone     Past Surgical History:  Procedure Laterality Date   INTRAUTERINE DEVICE INSERTION  02/19/2016   Mirena   WISDOM TOOTH EXTRACTION      Family History  Problem Relation Age of Onset   Hypertension Maternal Uncle    Diabetes Maternal Grandmother    Hypertension Maternal Grandmother    Hypertension Maternal Grandfather    Brain cancer Maternal Grandfather        with mets to lung, liver, kidney   Miscarriages / [place] Mother     Social History   Socioeconomic History   Marital status: Married    Spouse name: Molli Hazard   Number of children: Not on file   Years of education: High school   Highest education level: 12th grade  Occupational History   Not on file  Tobacco Use   Smoking status: Never   Smokeless tobacco: Never  Vaping Use   Vaping Use: Never used  Substance and Sexual Activity   Alcohol use: Yes    Comment: RARE-once a month   Drug use: Yes    Types: Cocaine   Sexual activity: Yes    Birth control/protection: I.U.D.    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   Married to [place] - known each other since high school   Pets: 2 dogs   Enjoys: hunting, racing, spend time with niece, camping   Exercise: not currently   Diet: cut back to 1 soda a day, hello fresh 2 nights a week for portion control   Social Determinants of Corporate investment bankerHealth   Financial Resource Strain: Low Risk    Difficulty of Paying Living Expenses: Not hard at all  Food Insecurity: No Food Insecurity   Worried About Programme researcher, broadcasting/film/videounning Out of Food in the Last Year: Never true   Baristaan Out of Food in the Last Year: Never true  Transportation Needs: No Transportation Needs   Lack of Transportation (Medical): No   Lack of  Transportation (Non-Medical): No  Physical Activity: Unknown   Days of Exercise per Week: Patient refused   Minutes of Exercise per Session: Not on file  Stress: Unknown   Feeling of Stress : Patient refused  Social Connections: Moderately Isolated   Frequency of Communication with Friends and Family: More than three times a week   Frequency of Social Gatherings with Friends and Family: Three times a week   Attends Religious Services: Never   Active Member of Clubs or Organizations: No   Attends Engineer, structuralClub or Organization Meetings: Not on file   Marital Status: Married  Catering managerntimate Partner Violence: Not on file    Outpatient Medications Prior to Visit  Medication Sig Dispense Refill   escitalopram (LEXAPRO) 10 MG tablet Take 1 tablet (10 mg total) by mouth daily. 30 tablet 1   fluticasone (FLONASE) 50 MCG/ACT nasal spray Place 2 sprays into both nostrils daily. (Patient taking differently: Place 2 sprays into both nostrils as needed.) 16 g 6   hydrOXYzine (ATARAX/VISTARIL) 50 MG tablet Take 0.5-2 tablets (25-100 mg total) by mouth 3 (three) times daily as needed for anxiety. 30 tablet 1   levonorgestrel (MIRENA) 20 MCG/24HR IUD 1 each by Intrauterine route once.     No facility-administered medications prior to visit.    Allergies  Allergen Reactions   Amoxicillin     Other reaction(s): Vomiting    Review of Systems  Constitutional:  Negative for fever.  Respiratory:  Negative for cough.   Cardiovascular:  Negative for chest pain and palpitations.  Psychiatric/Behavioral:  Negative for agitation, dysphoric mood, sleep disturbance and suicidal ideas. The patient is not nervous/anxious.       Objective:    Physical Exam Constitutional:      General: She is not in acute distress.    Appearance: Normal appearance. She is obese. She is not ill-appearing, toxic-appearing or diaphoretic.  HENT:     Head: Normocephalic.  Pulmonary:     Effort: Pulmonary effort is normal.   Neurological:     General: No focal deficit present.     Mental Status: She is alert and oriented to person, place, and time.  Psychiatric:        Mood and Affect: Mood normal.        Behavior: Behavior normal.        Thought Content: Thought content normal.        Judgment: Judgment normal.    Ht 5' 4.5" (1.638 m)    Wt 255 lb (115.7 kg)    BMI 43.09 kg/m  Wt Readings from Last 3 Encounters:  12/18/21  255 lb (115.7 kg)  11/14/21 255 lb 8 oz (115.9 kg)  09/26/21 244 lb (110.7 kg)       Assessment & Plan:   Problem List Items Addressed This Visit       Other   Generalized anxiety disorder    Refill for lexapro 10 mg , one po qd sent to patient pharmacy. Pt has noted much improvement since starting medication. Continue with current medication. F/u in May/June for CPE and follow up. If SI HI stop med and let me know immediately and or seek emergency help.        I am having Michele Burnett maintain her levonorgestrel, fluticasone, hydrOXYzine, and escitalopram.  No orders of the defined types were placed in this encounter.   I discussed the assessment and treatment plan with the patient. The patient was provided an opportunity to ask questions and all were answered. The patient agreed with the plan and demonstrated an understanding of the instructions.   The patient was advised to call back or seek an in-person evaluation if the symptoms worsen or if the condition fails to improve as anticipated.  I provided 15 minutes of face-to-face time during this encounter.   Mort Sawyers, FNP [facility] [place] [phone] (phone) [phone] (fax)  [facility]

## 2021-12-18 NOTE — Telephone Encounter (Signed)
Called and scheduled pt for TOC

## 2021-12-18 NOTE — Telephone Encounter (Signed)
I don't see Dr. Einar Pheasant having a problem with this. Okay to proceed with TOC.

## 2022-02-20 ENCOUNTER — Ambulatory Visit (INDEPENDENT_AMBULATORY_CARE_PROVIDER_SITE_OTHER): Payer: BC Managed Care – PPO | Admitting: Family

## 2022-02-20 ENCOUNTER — Encounter: Payer: BC Managed Care – PPO | Admitting: Family Medicine

## 2022-02-20 ENCOUNTER — Encounter: Payer: Self-pay | Admitting: Family

## 2022-02-20 ENCOUNTER — Other Ambulatory Visit: Payer: Self-pay

## 2022-02-20 VITALS — BP 122/86 | HR 94 | Ht 64.5 in | Wt 253.0 lb

## 2022-02-20 DIAGNOSIS — F411 Generalized anxiety disorder: Secondary | ICD-10-CM

## 2022-02-20 DIAGNOSIS — E782 Mixed hyperlipidemia: Secondary | ICD-10-CM | POA: Diagnosis not present

## 2022-02-20 DIAGNOSIS — Z6841 Body Mass Index (BMI) 40.0 and over, adult: Secondary | ICD-10-CM | POA: Insufficient documentation

## 2022-02-20 DIAGNOSIS — R0683 Snoring: Secondary | ICD-10-CM | POA: Diagnosis not present

## 2022-02-20 NOTE — Progress Notes (Signed)
? ?Established Patient Office Visit ? ?Subjective:  ?Patient ID: Michele Burnett, female    DOB: May 13, 1991  Age: 31 y.o. MRN: [id number] ? ?CC:  ?Chief Complaint  ?Patient presents with  ? transfer of care  ?  Pt stated--possible sleep apnea.  ? ? ?HPI ?Michele Burnett is here for a transition of care visit. ? ?Prior provider was: Gweneth Dimitri ?Pt is with acute concerns ?Husband c/o of her snoring, has tries raising head of bed, and using strips on nose but no improvement.  ?Downloaded app to track sleep, and even says she snores often. At apnea risk per her app.  ? ?Pap- may 2021, has f/u this year for repeat pap.  ? ?chronic concerns: ? ?GAD: lexapro 10 mg, still doing really well. 'it's great!'. Denies SI HI. States anxiety well controlled.  ? ?IUD in place- mirena placed in 2017 ? ? ?Past Medical History:  ?Diagnosis Date  ? Fracture 7-12  ? RIGHT FOOT  ? History of chickenpox   ? History of shingles   ? Ovarian cyst rupture 12-26-11  ? Sinus infection [id number]  ? ALSO DIAGNOSED TONSILLITIS  ? Tonsil stone   ? ? ?Past Surgical History:  ?Procedure Laterality Date  ? INTRAUTERINE DEVICE INSERTION  02/19/2016  ? Mirena  ? WISDOM TOOTH EXTRACTION    ? ? ?Family History  ?Problem Relation Age of Onset  ? Miscarriages / [place] Mother   ? Diabetes Maternal Grandmother   ? Hypertension Maternal Grandmother   ? Hypertension Maternal Grandfather   ? Brain cancer Maternal Grandfather   ?     with mets to lung, liver, kidney  ? Hypertension Maternal Uncle   ? ? ?Social History  ? ?Socioeconomic History  ? Marital status: Married  ?  Spouse name: Molli Hazard  ? Number of children: 0  ? Years of education: High school  ? Highest education level: 12th grade  ?Occupational History  ? Occupation: center of Chartered certified accountant  ?Tobacco Use  ? Smoking status: Never  ? Smokeless tobacco: Never  ?Vaping Use  ? Vaping Use: Never used  ?Substance and Sexual Activity  ? Alcohol use: Yes  ?  Comment: RARE-once a month  ? Drug use: Never  ?  Sexual activity: Yes  ?  Partners: Male  ?  Birth control/protection: I.U.D.  ?  Comment: Mirena IUD 02/19/2016 INTERCOURSE AGE 50, MORE THAN 5 SEXUAL PARTNERS  ?Other Topics Concern  ? Not on file  ?Social History Narrative  ? 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 Married to Molli Hazard - known each other since high school  ? Pets: 2 dogs  ? Enjoys: hunting, racing, spend time with niece, camping  ? Exercise: not currently  ? Diet: cut back to 1 soda a day, hello fresh 2 nights a week for portion control  ? ?Social Determinants of Health  ? ?Financial Resource Strain: Low Risk   ? Difficulty of Paying Living Expenses: Not hard at all  ?Food Insecurity: No Food Insecurity  ? Worried About Programme researcher, broadcasting/film/video in the Last Year: Never true  ? Ran Out of Food in the Last Year: Never true  ?Transportation Needs: No Transportation Needs  ? Lack of Transportation (Medical): No  ? Lack of Transportation (Non-Medical): No  ?Physical Activity: Unknown  ? Days of Exercise per Week: Patient refused  ? Minutes of Exercise per Session: Not on file  ?Stress: Unknown  ? Feeling of Stress : Patient refused  ?  Social Connections: Moderately Isolated  ? Frequency of Communication with Friends and Family: More than three times a week  ? Frequency of Social Gatherings with Friends and Family: Three times a week  ? Attends Religious Services: Never  ? Active Member of Clubs or Organizations: No  ? Attends Banker Meetings: Not on file  ? Marital Status: Married  ?Intimate Partner Violence: Not on file  ? ? ?Outpatient Medications Prior to Visit  ?Medication Sig Dispense Refill  ? escitalopram (LEXAPRO) 10 MG tablet Take 1 tablet (10 mg total) by mouth daily. 90 tablet 1  ? fluticasone (FLONASE) 50 MCG/ACT nasal spray Place 2 sprays into both nostrils daily. (Patient taking differently: Place 2 sprays into both nostrils as needed.) 16 g 6  ? hydrOXYzine (ATARAX/VISTARIL) 50 MG tablet Take 0.5-2 tablets (25-100 mg total) by  mouth 3 (three) times daily as needed for anxiety. 30 tablet 1  ? levonorgestrel (MIRENA) 20 MCG/24HR IUD 1 each by Intrauterine route once.    ? ?No facility-administered medications prior to visit.  ? ? ?Allergies  ?Allergen Reactions  ? Amoxicillin   ?  Other reaction(s): Vomiting  ? ? ?ROS ?Review of Systems  ?Constitutional:  Negative for chills, fatigue, fever and unexpected weight change.  ?HENT:  Negative for congestion, ear pain, postnasal drip, sinus pressure, sinus pain and sore throat.   ?Eyes:  Negative for visual disturbance.  ?Respiratory:  Negative for shortness of breath.   ?     Snoring ?  ?Cardiovascular:  Negative for chest pain.  ?Gastrointestinal:  Negative for abdominal pain.  ?Genitourinary:  Negative for decreased urine volume, difficulty urinating, dysuria, menstrual problem, pelvic pain, urgency, vaginal bleeding, vaginal discharge and vaginal pain.  ?Skin:  Negative for rash.  ?Neurological:  Negative for dizziness, light-headedness and headaches.  ?Psychiatric/Behavioral:  Negative for suicidal ideas. The patient is not nervous/anxious.   ? ? ? ?  ?Objective:  ?  ?Physical Exam ?Constitutional:   ?   General: She is not in acute distress. ?   Appearance: Normal appearance. She is obese. She is not ill-appearing, toxic-appearing or diaphoretic.  ?HENT:  ?   Head: Normocephalic.  ?   Right Ear: Tympanic membrane normal.  ?   Left Ear: Tympanic membrane normal.  ?   Nose: Nose normal.  ?   Mouth/Throat:  ?   Mouth: Mucous membranes are moist.  ?Eyes:  ?   Pupils: Pupils are equal, round, and reactive to light.  ?Cardiovascular:  ?   Rate and Rhythm: Normal rate and regular rhythm.  ?Pulmonary:  ?   Effort: Pulmonary effort is normal.  ?   Breath sounds: Normal breath sounds.  ?Skin: ?   General: Skin is warm.  ?Neurological:  ?   General: No focal deficit present.  ?   Mental Status: She is alert and oriented to person, place, and time.  ?Psychiatric:     ?   Mood and Affect: Mood normal.      ?   Behavior: Behavior normal.     ?   Thought Content: Thought content normal.     ?   Judgment: Judgment normal.  ? ? ? ?BP 122/86   Pulse 94   Ht 5' 4.5" (1.638 m)   Wt 253 lb (114.8 kg)   SpO2 97%   BMI 42.76 kg/m?  ?Wt Readings from Last 3 Encounters:  ?02/20/22 253 lb (114.8 kg)  ?12/18/21 255 lb (115.7 kg)  ?11/14/21 255 lb  8 oz (115.9 kg)  ? ? ? ?There are no preventive care reminders to display for this patient. ? ? ?There are no preventive care reminders to display for this patient. ? ?Lab Results  ?Component Value Date  ? TSH 2.82 04/03/2020  ? ?Lab Results  ?Component Value Date  ? WBC 11.2 (H) 04/03/2020  ? HGB 13.4 04/03/2020  ? HCT 40.7 04/03/2020  ? MCV 86.4 04/03/2020  ? PLT 404 (H) 04/03/2020  ? ?Lab Results  ?Component Value Date  ? NA 136 04/03/2020  ? K 4.1 04/03/2020  ? CO2 23 04/03/2020  ? GLUCOSE 91 04/03/2020  ? BUN 11 04/03/2020  ? CREATININE 0.65 04/03/2020  ? BILITOT 0.2 04/03/2020  ? AST 12 04/03/2020  ? ALT 13 04/03/2020  ? PROT 7.0 04/03/2020  ? CALCIUM 9.1 04/03/2020  ? ANIONGAP 10 12/26/2011  ? ?Lab Results  ?Component Value Date  ? CHOL 199 04/03/2020  ? ?Lab Results  ?Component Value Date  ? HDL 43 (L) 04/03/2020  ? ?Lab Results  ?Component Value Date  ? LDLCALC 122 (H) 04/03/2020  ? ?Lab Results  ?Component Value Date  ? TRIG 220 (H) 04/03/2020  ? ?Lab Results  ?Component Value Date  ? CHOLHDL 4.6 04/03/2020  ? ?Lab Results  ?Component Value Date  ? HGBA1C 5.3 02/23/2018  ? ? ?  ?Assessment & Plan:  ? ?Problem List Items Addressed This Visit   ? ?  ? Other  ? Morbid obesity with BMI of 40.0-44.9, adult (HCC)  ?  Can contribute to apnea, work on diet and exercise as tolerated. ?  ?  ? Relevant Orders  ? Comprehensive metabolic panel  ? Generalized anxiety disorder  ?  Doing well on lexapro, sent in refill for lexapro 10 mg  ?Pt to take as directed ?Anxiety reducing tecniques ? ?  ?  ? Relevant Orders  ? Comprehensive metabolic panel  ? TSH  ? Mixed hyperlipidemia  ?  Ordered  lipid panel, pending results. Work on low cholesterol diet and exercise as tolerated ? ?  ?  ? Relevant Orders  ? Lipid panel  ? Snoring - Primary  ?  Referral placed for sleep medicine for sleep study t

## 2022-02-20 NOTE — Patient Instructions (Addendum)
A referral was placed today for sleep study. ?Please let us know if you have not heard back within 1 week about your referral. ? ?I have created an order for lab work today during our visit.  ?Please schedule an appointment on your way out to return to the lab at your convenience. Please return fasting at your lab appointment (meaning you can only drink black coffee and or water prior to your appointment). I will reach out to you in regards to the labs when I receive the results.  ? ? ?It was a pleasure seeing you today! Please do not hesitate to reach out with any questions and or concerns. ? ?Regards,  ? ?Madyson Lukach ?FNP-C ? ?

## 2022-02-23 NOTE — Assessment & Plan Note (Signed)
Referral placed for sleep medicine for sleep study to r/o sleep apnea ?

## 2022-02-23 NOTE — Assessment & Plan Note (Signed)
Ordered lipid panel, pending results. Work on low cholesterol diet and exercise as tolerated ? ?

## 2022-02-23 NOTE — Assessment & Plan Note (Signed)
Doing well on lexapro, sent in refill for lexapro 10 mg  ?Pt to take as directed ?Anxiety reducing tecniques ? ?

## 2022-02-23 NOTE — Assessment & Plan Note (Signed)
Can contribute to apnea, work on diet and exercise as tolerated. ?

## 2022-03-07 ENCOUNTER — Encounter: Payer: Self-pay | Admitting: Family

## 2022-03-20 NOTE — Telephone Encounter (Signed)
Pt called in wants to know status of her referral for a sleep study . Please Advise [phone]  ?

## 2022-03-24 ENCOUNTER — Other Ambulatory Visit (INDEPENDENT_AMBULATORY_CARE_PROVIDER_SITE_OTHER): Payer: BC Managed Care – PPO

## 2022-03-24 DIAGNOSIS — E782 Mixed hyperlipidemia: Secondary | ICD-10-CM

## 2022-03-24 DIAGNOSIS — Z6841 Body Mass Index (BMI) 40.0 and over, adult: Secondary | ICD-10-CM | POA: Diagnosis not present

## 2022-03-24 DIAGNOSIS — F411 Generalized anxiety disorder: Secondary | ICD-10-CM

## 2022-03-24 DIAGNOSIS — R0683 Snoring: Secondary | ICD-10-CM

## 2022-03-24 LAB — CBC WITH DIFFERENTIAL/PLATELET
Basophils Absolute: 0.1 10*3/uL (ref 0.0–0.1)
Basophils Relative: 0.9 % (ref 0.0–3.0)
Eosinophils Absolute: 0.2 10*3/uL (ref 0.0–0.7)
Eosinophils Relative: 2.3 % (ref 0.0–5.0)
HCT: 39.7 % (ref 36.0–46.0)
Hemoglobin: 13.3 g/dL (ref 12.0–15.0)
Lymphocytes Relative: 28.4 % (ref 12.0–46.0)
Lymphs Abs: 2.9 10*3/uL (ref 0.7–4.0)
MCHC: 33.5 g/dL (ref 30.0–36.0)
MCV: 86.6 fl (ref 78.0–100.0)
Monocytes Absolute: 0.6 10*3/uL (ref 0.1–1.0)
Monocytes Relative: 6.1 % (ref 3.0–12.0)
Neutro Abs: 6.3 10*3/uL (ref 1.4–7.7)
Neutrophils Relative %: 62.3 % (ref 43.0–77.0)
Platelets: 356 10*3/uL (ref 150.0–400.0)
RBC: 4.59 Mil/uL (ref 3.87–5.11)
RDW: 12.9 % (ref 11.5–15.5)
WBC: 10.2 10*3/uL (ref 4.0–10.5)

## 2022-03-24 LAB — COMPREHENSIVE METABOLIC PANEL
ALT: 15 U/L (ref 0–35)
AST: 13 U/L (ref 0–37)
Albumin: 4.3 g/dL (ref 3.5–5.2)
Alkaline Phosphatase: 63 U/L (ref 39–117)
BUN: 11 mg/dL (ref 6–23)
CO2: 26 mEq/L (ref 19–32)
Calcium: 8.6 mg/dL (ref 8.4–10.5)
Chloride: 104 mEq/L (ref 96–112)
Creatinine, Ser: 0.59 mg/dL (ref 0.40–1.20)
GFR: 121.46 mL/min (ref >60.00)
Glucose, Bld: 101 mg/dL — ABNORMAL HIGH (ref 70–99)
Potassium: 4 mEq/L (ref 3.5–5.1)
Sodium: 138 mEq/L (ref 135–145)
Total Bilirubin: 0.5 mg/dL (ref 0.2–1.2)
Total Protein: 6.9 g/dL (ref 6.0–8.3)

## 2022-03-24 LAB — LIPID PANEL
Cholesterol: 177 mg/dL (ref 0–200)
HDL: 40.3 mg/dL (ref >39.00)
LDL Cholesterol: 102 mg/dL — ABNORMAL HIGH (ref 0–99)
NonHDL: 136.2
Total CHOL/HDL Ratio: 4
Triglycerides: 169 mg/dL — ABNORMAL HIGH (ref 0.0–149.0)
VLDL: 33.8 mg/dL (ref 0.0–40.0)

## 2022-03-24 LAB — TSH: TSH: 1.81 u[IU]/mL (ref 0.35–5.50)

## 2022-04-09 ENCOUNTER — Ambulatory Visit (INDEPENDENT_AMBULATORY_CARE_PROVIDER_SITE_OTHER): Payer: BC Managed Care – PPO | Admitting: Nurse Practitioner

## 2022-04-09 ENCOUNTER — Encounter: Payer: Self-pay | Admitting: Nurse Practitioner

## 2022-04-09 VITALS — BP 114/84 | HR 90 | Ht 64.25 in | Wt 256.0 lb

## 2022-04-09 DIAGNOSIS — T8332XA Displacement of intrauterine contraceptive device, initial encounter: Secondary | ICD-10-CM

## 2022-04-09 DIAGNOSIS — N9412 Deep dyspareunia: Secondary | ICD-10-CM | POA: Diagnosis not present

## 2022-04-09 DIAGNOSIS — Z01419 Encounter for gynecological examination (general) (routine) without abnormal findings: Secondary | ICD-10-CM | POA: Diagnosis not present

## 2022-04-09 DIAGNOSIS — R1030 Lower abdominal pain, unspecified: Secondary | ICD-10-CM

## 2022-04-09 NOTE — Progress Notes (Signed)
? ?  Michele Burnett [birth year] [id number] ? ? ?History:  31 y.o. G0 presents for annual exam. Reports intermittent cramping x 3 months that occurs once a month for 2-3 days. She feels it is similar to when she had ovarian cysts in the past. She had episode of bleeding once with cramping episode. Occasional spotting with Mirena IUD, inserted 01/2016. She has also has pain with deep penetration that she does not feel is positional. Normal pap history. Has received Gardasil series.  ? ?Gynecologic History ?No LMP recorded. (Menstrual status: IUD). ?  ?Contraception/Family planning: IUD ?Sexually active: Yes ? ?Health Maintenance ?Last Pap: 04/03/2020. Results were: Normal ?Last mammogram: Not indicated ?Last colonoscopy: No indicated ?Last Dexa: Not indicated ? ?Past medical history, past surgical history, family history and social history were all reviewed and documented in the EPIC chart. 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  ? ?ROS:  A ROS was performed and pertinent positives and negatives are included. ? ?Exam: ? ?Vitals:  ? 04/09/22 1456  ?BP: 114/84  ?Pulse: 90  ?SpO2: 98%  ?Weight: 256 lb (116.1 kg)  ?Height: 5' 4.25" (1.632 m)  ? ? ?Body mass index is 43.6 kg/m?. ? ?General appearance:  Normal ?Thyroid:  Symmetrical, normal in size, without palpable masses or nodularity. ?Respiratory ? Auscultation:  Clear without wheezing or rhonchi ?Cardiovascular ? Auscultation:  Regular rate, without rubs, murmurs or gallops ? Edema/varicosities:  Not grossly evident ?Abdominal ? Soft,nontender, without masses, guarding or rebound. ? Liver/spleen:  No organomegaly noted ? Hernia:  None appreciated ? Skin ? Inspection:  Grossly normal ?Breasts: Examined lying and sitting.  ? Right: Without masses, retractions, nipple discharge or axillary adenopathy. ? ? Left: Without masses, retractions, nipple discharge or axillary adenopathy. ?Genitourinary  ? Inguinal/mons:  Normal without inguinal adenopathy ? External genitalia:   Normal appearing vulva with no masses, tenderness, or lesions ? BUS/Urethra/Skene's glands:  Normal ? Vagina:  Normal appearing with normal color and discharge, no lesions ? Cervix:  Normal appearing without discharge or lesions. IUD string not visualized ? Uterus:  Normal in size, shape and contour.  Midline and mobile, nontender ? Adnexa/parametria:   ?  Rt: Normal in size, without masses or tenderness. ?  Lt: Normal in size, without masses or tenderness. ? Anus and perineum: Normal ? ?Patient informed chaperone available to be present for breast and pelvic exam. Patient has requested no chaperone to be present. Patient has been advised what will be completed during breast and pelvic exam.  ? ?Assessment/Plan:  31 y.o. G0 for annual exam.  ? ?Well female exam with routine gynecological exam - Education provided on SBEs, importance of preventative screenings, current guidelines, high calcium diet, regular exercise, and multivitamin daily. Labs with PCP. ? ?Lower abdominal pain - Plan: US PELVIS TRANSVAGINAL NON-OB (TV ONLY). X 3 months, cramping, intermittent and lasts 2-3 days. Bleeding episode once with pain.  ? ?Deep dyspareunia - Plan: US PELVIS TRANSVAGINAL NON-OB (TV ONLY). Began a few months ago. Is not positional.  ? ?Intrauterine contraceptive device threads lost, initial encounter - Plan: US PELVIS TRANSVAGINAL NON-OB (TV ONLY). IUD sting not visualized. Backup contraception until confirmed.  ? ?Screening for cervical cancer - Normal pap history. Will repeat at 3-year interval per guidelines.  ? ?Return in 1 year for annual.  ? ? ?Tamela Gammon DNP, 3:04 PM 04/09/2022 ? ?

## 2022-04-21 ENCOUNTER — Encounter: Payer: Self-pay | Admitting: Adult Health

## 2022-04-21 ENCOUNTER — Ambulatory Visit (INDEPENDENT_AMBULATORY_CARE_PROVIDER_SITE_OTHER): Payer: BC Managed Care – PPO | Admitting: Adult Health

## 2022-04-21 DIAGNOSIS — R0683 Snoring: Secondary | ICD-10-CM

## 2022-04-21 DIAGNOSIS — Z6841 Body Mass Index (BMI) 40.0 and over, adult: Secondary | ICD-10-CM

## 2022-04-21 NOTE — Patient Instructions (Signed)
Set up for home sleep study .  Healthy sleep regimen  Do not drive if sleepy  Follow up in 6-8 weeks to discuss results .  

## 2022-04-21 NOTE — Progress Notes (Unsigned)
@Patient  ID: , female    DOB: 03/07/1991, 31 y.o.   MRN: [id number]  Chief Complaint  Patient presents with   Consult    Referring provider: [id number], FNP  HPI: 31 year old female presents for sleep consult Apr 21, 2022 for snoring, restless sleep, daytime sleepiness  TEST/EVENTS :   04/21/22 Sleep consult  Patient presents for a sleep consult today.  Patient complains of heavy snoring, fatigue, restless sleep, daytime sleepiness.  Wakes up tired and feels unrefreshed.  Snoring is disrupting her spouse's sleep.  She is now sleeping separately due to this.  Typically goes to bed about 9 PM.  Takes about 10 to 15 minutes to go to sleep.  Is up once or twice at night.  Is up at 6:15 AM.  Does not operate heavy machinery.  Weight is increased up 15 pounds over the last 2 years current weight is at 251 with a BMI of 43.  She has never had a previous sleep study.  No history of congestive heart failure or stroke.  She is not on any sedating medications.  Typically takes in about 1 soda daily.  She does not do any energy drinks or energy pills.  Patient says she is not very active does not exercise on a regular basis.  And she does not nap.  No symptoms suspicious for cataplexy or sleep paralysis.  Epworth score is 11 out of 24.  Mainly gets sleepy with sitting still and watching TV.  Also in the afternoon hours.  Medical history is significant for hyperlipidemia  Surgical history none  Family history positive for emphysema, heart disease and sleep apnea.,  Positive for cancer.  Social history patient is married.  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  She has no children.  She lives at home with her husband.  She is a never smoker.  Rare alcohol use.  No drug use.  Allergies  Allergen Reactions   Amoxicillin     Other reaction(s): Vomiting    Immunization History  Administered Date(s) Administered   HPV Quadrivalent 01/04/2008   Influenza,inj,Quad PF,6+ Mos  08/31/2020, 09/14/2021   Influenza-Unspecified 08/22/2015, 09/01/2019   PFIZER Comirnaty(Gray Top)Covid-19 Tri-Sucrose Vaccine 07/29/2020, 08/19/2020   PPD Test 08/24/2018   Tdap 02/08/2013    Past Medical History:  Diagnosis Date   Fracture 7-12   RIGHT FOOT   History of chickenpox    History of shingles    Ovarian cyst rupture 12-26-11   Sinus infection 012513   ALSO DIAGNOSED TONSILLITIS   Tonsil stone     Tobacco History: Social History   Tobacco Use  Smoking Status Never  Smokeless Tobacco Never   Counseling given: Not Answered   Outpatient Medications Prior to Visit  Medication Sig Dispense Refill   levonorgestrel (MIRENA) 20 MCG/24HR IUD 1 each by Intrauterine route once.     escitalopram (LEXAPRO) 10 MG tablet Take 1 tablet (10 mg total) by mouth daily. 90 tablet 1   fluticasone (FLONASE) 50 MCG/ACT nasal spray Place 2 sprays into both nostrils daily. (Patient not taking: Reported on 04/21/2022) 16 g 6   hydrOXYzine (ATARAX/VISTARIL) 50 MG tablet Take 0.5-2 tablets (25-100 mg total) by mouth 3 (three) times daily as needed for anxiety. (Patient not taking: Reported on 04/09/2022) 30 tablet 1   No facility-administered medications prior to visit.     Review of Systems:   Constitutional:   No  weight loss, night sweats,  Fevers, chills,  +fatigue, or  lassitude.  HEENT:   No headaches,  Difficulty swallowing,  Tooth/dental problems, or  Sore throat,                No sneezing, itching, ear ache, nasal congestion, post nasal drip,   CV:  No chest pain,  Orthopnea, PND, swelling in lower extremities, anasarca, dizziness, palpitations, syncope.   GI  No heartburn, indigestion, abdominal pain, nausea, vomiting, diarrhea, change in bowel habits, loss of appetite, bloody stools.   Resp: No shortness of breath with exertion or at rest.  No excess mucus, no productive cough,  No non-productive cough,  No coughing up of blood.  No change in color of mucus.  No wheezing.   No chest wall deformity  Skin: no rash or lesions.  GU: no dysuria, change in color of urine, no urgency or frequency.  No flank pain, no hematuria   MS:  No joint pain or swelling.  No decreased range of motion.  No back pain.    Physical Exam  BP 120/80 (BP Location: Left Arm, Patient Position: Sitting, Cuff Size: Large)   Pulse 85   Temp 98.6 F (37 C) (Oral)   Ht 5\' 4"  (1.626 m)   Wt 251 lb 3.2 oz (113.9 kg)   SpO2 97%   BMI 43.12 kg/m   GEN: A/Ox3; pleasant , NAD, well nourished    HEENT:  [place]/AT,  EACs-clear, TMs-wnl, NOSE-clear, THROAT-clear, no lesions, no postnasal drip or exudate noted. Class 2-3 MP airway , elongated Uvula .   NECK:  Supple w/ fair ROM; no JVD; normal carotid impulses w/o bruits; no thyromegaly or nodules palpated; no lymphadenopathy.    RESP  Clear  P & A; w/o, wheezes/ rales/ or rhonchi. no accessory muscle use, no dullness to percussion  CARD:  RRR, no m/r/g, no peripheral edema, pulses intact, no cyanosis or clubbing.  GI:   Soft & nt; nml bowel sounds; no organomegaly or masses detected.   Musco: Warm bil, no deformities or joint swelling noted.   Neuro: alert, no focal deficits noted.    Skin: Warm, no lesions or rashes    Lab Results:  CBC    Component Value Date/Time   WBC 10.2 03/24/2022 0814   RBC 4.59 03/24/2022 0814   HGB 13.3 03/24/2022 0814   HCT 39.7 03/24/2022 0814   PLT 356.0 03/24/2022 0814   MCV 86.6 03/24/2022 0814   MCH 28.5 04/03/2020 1612   MCHC 33.5 03/24/2022 0814   RDW 12.9 03/24/2022 0814   LYMPHSABS 2.9 03/24/2022 0814   MONOABS 0.6 03/24/2022 0814   EOSABS 0.2 03/24/2022 0814   BASOSABS 0.1 03/24/2022 0814    BMET    Component Value Date/Time   NA 138 03/24/2022 0814   NA 142 12/26/2011 1243   K 4.0 03/24/2022 0814   K 3.7 12/26/2011 1243   CL 104 03/24/2022 0814   CL 104 12/26/2011 1243   CO2 26 03/24/2022 0814   CO2 28 12/26/2011 1243   GLUCOSE 101 (H) 03/24/2022 0814   GLUCOSE 91  12/26/2011 1243   BUN 11 03/24/2022 0814   BUN 7 12/26/2011 1243   CREATININE 0.59 03/24/2022 0814   CREATININE 0.65 04/03/2020 1612   CALCIUM 8.6 03/24/2022 0814   CALCIUM 9.1 12/26/2011 1243   GFRNONAA >60 12/26/2011 1243   GFRAA >60 12/26/2011 1243    BNP No results found for: BNP  ProBNP No results found for: PROBNP  Imaging: No results found.  View : No data to display.          No results found for: NITRICOXIDE      Assessment & Plan:   No problem-specific Assessment & Plan notes found for this encounter.     Rubye Oaksammy Fidelis Loth, NP 04/21/2022

## 2022-04-22 ENCOUNTER — Encounter: Payer: Self-pay | Admitting: Adult Health

## 2022-04-22 NOTE — Assessment & Plan Note (Signed)
Healthy weight loss discussed 

## 2022-04-22 NOTE — Assessment & Plan Note (Signed)
Snoring, restless sleep, daytime sleepiness, BMI 43 all suspicious for underlying sleep apnea.  Patient will be set up for home sleep study.  Patient education on sleep apnea  - discussed how weight can impact sleep and risk for sleep disordered breathing - discussed options to assist with weight loss: combination of diet modification, cardiovascular and strength training exercises   - had an extensive discussion regarding the adverse health consequences related to untreated sleep disordered breathing - specifically discussed the risks for hypertension, coronary artery disease, cardiac dysrhythmias, cerebrovascular disease, and diabetes - lifestyle modification discussed   - discussed how sleep disruption can increase risk of accidents, particularly when driving - safe driving practices were discussed   Plan  Patient Instructions  Set up for home sleep study  Healthy sleep regimen  Do not drive if sleepy.  Follow up in 6-8 weeks to discuss results .

## 2022-05-15 ENCOUNTER — Ambulatory Visit (INDEPENDENT_AMBULATORY_CARE_PROVIDER_SITE_OTHER): Payer: BC Managed Care – PPO

## 2022-05-15 ENCOUNTER — Other Ambulatory Visit: Payer: BC Managed Care – PPO | Admitting: Nurse Practitioner

## 2022-05-15 ENCOUNTER — Ambulatory Visit: Payer: BC Managed Care – PPO

## 2022-05-15 DIAGNOSIS — N9412 Deep dyspareunia: Secondary | ICD-10-CM | POA: Diagnosis not present

## 2022-05-15 DIAGNOSIS — R1030 Lower abdominal pain, unspecified: Secondary | ICD-10-CM | POA: Diagnosis not present

## 2022-05-15 DIAGNOSIS — T8332XA Displacement of intrauterine contraceptive device, initial encounter: Secondary | ICD-10-CM

## 2022-06-10 ENCOUNTER — Other Ambulatory Visit: Payer: BC Managed Care – PPO

## 2022-06-21 ENCOUNTER — Other Ambulatory Visit: Payer: Self-pay | Admitting: Family

## 2022-06-21 DIAGNOSIS — F411 Generalized anxiety disorder: Secondary | ICD-10-CM

## 2022-08-12 DIAGNOSIS — K625 Hemorrhage of anus and rectum: Secondary | ICD-10-CM | POA: Diagnosis not present

## 2022-08-25 ENCOUNTER — Encounter: Payer: Self-pay | Admitting: Family

## 2022-08-25 ENCOUNTER — Ambulatory Visit (INDEPENDENT_AMBULATORY_CARE_PROVIDER_SITE_OTHER): Payer: BC Managed Care – PPO | Admitting: Family

## 2022-08-25 VITALS — BP 112/78 | HR 92 | Temp 98.7°F | Resp 16 | Ht 64.0 in | Wt 248.5 lb

## 2022-08-25 DIAGNOSIS — K625 Hemorrhage of anus and rectum: Secondary | ICD-10-CM | POA: Diagnosis not present

## 2022-08-25 DIAGNOSIS — H6991 Unspecified Eustachian tube disorder, right ear: Secondary | ICD-10-CM | POA: Diagnosis not present

## 2022-08-25 DIAGNOSIS — R0683 Snoring: Secondary | ICD-10-CM | POA: Diagnosis not present

## 2022-08-25 DIAGNOSIS — F411 Generalized anxiety disorder: Secondary | ICD-10-CM | POA: Diagnosis not present

## 2022-08-25 MED ORDER — ESCITALOPRAM OXALATE 10 MG PO TABS: 10.0000 mg | ORAL_TABLET | Freq: Every day | ORAL | 3 refills | Status: DC

## 2022-08-25 NOTE — Patient Instructions (Addendum)
  Stop by the lab prior to leaving today. I will notify you of your results once received.    Regards,   Serenah Mill FNP-C  

## 2022-08-25 NOTE — Assessment & Plan Note (Signed)
Continue lexapro 10 mg once daily Work on anxiety reducing techniques Reviewed gad 7 and phq9 

## 2022-08-25 NOTE — Assessment & Plan Note (Signed)
F/u with gi  Improving slight with hemorrhoid cream  Recommend sitz baths

## 2022-08-25 NOTE — Progress Notes (Signed)
Established Patient Office Visit  Subjective:  Patient ID: Michele Burnett, female    DOB: 11/23/1991  Age: 31 y.o. MRN: [id number]  CC:  Chief Complaint  Patient presents with   Medication Refill    HPI Michele Burnett is here today for follow up.   Pt is with acute concerns.  Anxiety depression: still doing well on lexapro 10 mg once daily. Has had some days with more increased stress and lack of focus however recently went on a work trip which she thinks threw her up, had never flown and had to fly otherwise doing well. Doing well on this dose.     08/25/2022    3:18 PM 11/14/2021   12:18 PM 09/26/2021    9:26 AM  PHQ9 SCORE ONLY  PHQ-9 Total Score 1 8 0      08/25/2022    3:18 PM 12/18/2021    8:11 AM 11/14/2021   12:18 PM 09/26/2021    9:24 AM  GAD 7 : Generalized Anxiety Score  Nervous, Anxious, on Edge 1 0 2 2  Control/stop worrying 0 0 2 2  Worry too much - different things 0 0 2 2  Trouble relaxing 1 0 2 3  Restless 0 0 2 2  Easily annoyed or irritable 0 0 3 2  Afraid - awful might happen 0 0 1 1  Total GAD 7 Score 2 0 14 14  Anxiety Difficulty Not difficult at all Not difficult at all Very difficult Somewhat difficult    Snoring, seen by pulmonary. Set up for sleep study however pt states has not yet been scheduled.   Rectal bleeding: seen by dr. Tally Due, has started some hemorrhoid cream which has helped a bit, however bowel movements are normal not constipated. Colonoscopy 10/07/22. Does have pressure at anal area at times, not painful. Not itchy. Not always pressure, but only at times but won't felel like she has to poop.   Past Medical History:  Diagnosis Date   Fracture 7-12   RIGHT FOOT   History of chickenpox    History of shingles    Ovarian cyst rupture 12-26-11   Sinus infection 012513   ALSO DIAGNOSED TONSILLITIS   Tonsil stone     Past Surgical History:  Procedure Laterality Date   INTRAUTERINE DEVICE INSERTION  02/19/2016   Mirena   WISDOM  TOOTH EXTRACTION      Family History  Problem Relation Age of Onset   Diabetes Mother    Miscarriages / [place] Mother    Hypertension Maternal Uncle    Diabetes Maternal Grandmother    Hypertension Maternal Grandmother    Hypertension Maternal Grandfather    Brain cancer Maternal Grandfather        with mets to lung, liver, kidney    Social History   Socioeconomic History   Marital status: Married    Spouse name: Rodman Key   Number of children: 0   Years of education: High school   Highest education level: 12th grade  Occupational History   Occupation: [employer] sales [employer]  Tobacco Use   Smoking status: Never   Smokeless tobacco: Never  Vaping Use   Vaping Use: Never used  Substance and Sexual Activity   Alcohol use: Yes    Comment: RARE-once a month   Drug use: Never   Sexual activity: Yes    Partners: Male    Birth control/protection: I.U.D.    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   Married to [place] - known each other since high school   Pets: 2 dogs   Enjoys: hunting, racing, spend time with niece, camping   Exercise: not currently   Diet: cut back to 1 soda a day, hello fresh 2 nights a week for portion control   Social Determinants of Health   Financial Resource Strain: Low Risk  (12/17/2021)   Overall Financial Resource Strain (CARDIA)    Difficulty of Paying Living Expenses: Not hard at all  Food Insecurity: No Food Insecurity (12/17/2021)   Hunger Vital Sign    Worried About Running Out of Food in the Last Year: Never true    [place] in the Last Year: Never true  Transportation Needs: No Transportation Needs (12/17/2021)   PRAPARE - Hydrologist (Medical): No    Lack of Transportation (Non-Medical): No  Physical Activity: Unknown (12/17/2021)   Exercise  Vital Sign    Days of Exercise per Week: Patient refused    Minutes of Exercise per Session: Not on file  Stress: Unknown (12/17/2021)   [place]    Feeling of Stress : Patient refused  Social Connections: Moderately Isolated (12/17/2021)   Social Connection and Isolation Panel [NHANES]    Frequency of Communication with Friends and Family: More than three times a week    Frequency of Social Gatherings with Friends and Family: Three times a week    Attends Religious Services: Never    Active Member of Clubs or Organizations: No    Attends Music therapist: Not on file    Marital Status: Married  Human resources officer Violence: Not on file    Outpatient Medications Prior to Visit  Medication Sig Dispense Refill   fluticasone (FLONASE) 50 MCG/ACT nasal spray Place 2 sprays into both nostrils daily. 16 g 6   hydrOXYzine (ATARAX/VISTARIL) 50 MG tablet Take 0.5-2 tablets (25-100 mg total) by mouth 3 (three) times daily as needed for anxiety. 30 tablet 1   levonorgestrel (MIRENA) 20 MCG/24HR IUD 1 each by Intrauterine route once.     escitalopram (LEXAPRO) 10 MG tablet TAKE 1 TABLET BY MOUTH EVERY DAY 90 tablet 1   No facility-administered medications prior to visit.    Allergies  Allergen Reactions   Amoxicillin     Other reaction(s): Vomiting         Objective:    Physical Exam Constitutional:      General: She is not in acute distress.    Appearance: Normal appearance. She is obese. She is not ill-appearing, toxic-appearing or diaphoretic.  Cardiovascular:     Rate and Rhythm: Normal rate and regular rhythm.  Pulmonary:     Effort: Pulmonary effort is normal.     Breath sounds: Normal breath sounds.  Abdominal:     General: Abdomen is flat.  Neurological:     General: No focal deficit present.     Mental Status: She is alert and oriented to person, place, and time. Mental status is at baseline.   Psychiatric:        Mood and Affect: Mood normal.        Behavior: Behavior normal.        Thought Content: Thought content normal.        Judgment: Judgment normal.  BP 112/78   Pulse 92   Temp 98.7 F (37.1 C)   Resp 16   Ht 5\' 4"  (1.626 m)   Wt 248 lb 8 oz (112.7 kg)   SpO2 99%   BMI 42.65 kg/m  Wt Readings from Last 3 Encounters:  08/25/22 248 lb 8 oz (112.7 kg)  04/21/22 251 lb 3.2 oz (113.9 kg)  04/09/22 256 lb (116.1 kg)     Health Maintenance Due  Topic Date Due   HPV VACCINES (2 - 3-dose series) 02/01/2008   COVID-19 Vaccine (3 - Pfizer series) 10/14/2020   INFLUENZA VACCINE  07/01/2022       Topic Date Due   HPV VACCINES (2 - 3-dose series) 02/01/2008    Lab Results  Component Value Date   TSH 1.81 03/24/2022   Lab Results  Component Value Date   WBC 10.2 03/24/2022   HGB 13.3 03/24/2022   HCT 39.7 03/24/2022   MCV 86.6 03/24/2022   PLT 356.0 03/24/2022   Lab Results  Component Value Date   NA 138 03/24/2022   K 4.0 03/24/2022   CO2 26 03/24/2022   GLUCOSE 101 (H) 03/24/2022   BUN 11 03/24/2022   CREATININE 0.59 03/24/2022   BILITOT 0.5 03/24/2022   ALKPHOS 63 03/24/2022   AST 13 03/24/2022   ALT 15 03/24/2022   PROT 6.9 03/24/2022   ALBUMIN 4.3 03/24/2022   CALCIUM 8.6 03/24/2022   ANIONGAP 10 12/26/2011   GFR 121.46 03/24/2022   Lab Results  Component Value Date   CHOL 177 03/24/2022   Lab Results  Component Value Date   HDL 40.30 03/24/2022   Lab Results  Component Value Date   LDLCALC 102 (H) 03/24/2022   Lab Results  Component Value Date   TRIG 169.0 (H) 03/24/2022   Lab Results  Component Value Date   CHOLHDL 4 03/24/2022   Lab Results  Component Value Date   HGBA1C 5.3 02/23/2018      Assessment & Plan:   Problem List Items Addressed This Visit       Digestive   Rectal bleeding - Primary    F/u with gi  Improving slight with hemorrhoid cream  Recommend sitz baths      Relevant Orders    CBC with Differential     Nervous and Auditory   Disorder of right eustachian tube     Other   Generalized anxiety disorder    Continue lexapro 10 mg once daily Work on anxiety reducing techniques  Reviewed gad7 and phq9      Relevant Medications   escitalopram (LEXAPRO) 10 MG tablet   Snoring    Call back pulmonary for possible sleep study to schedule       Meds ordered this encounter  Medications   escitalopram (LEXAPRO) 10 MG tablet    Sig: Take 1 tablet (10 mg total) by mouth daily.    Dispense:  90 tablet    Refill:  3    Order Specific Question:   Supervising Provider    Answer:   Diona Browner, AMY E [id number]    Follow-up: Return in about 6 months (around 02/23/2023) for f/u anxiety.    Eugenia Pancoast, FNP

## 2022-08-25 NOTE — Assessment & Plan Note (Signed)
Call back pulmonary for possible sleep study to schedule

## 2022-08-26 LAB — CBC WITH DIFFERENTIAL/PLATELET
Basophils Absolute: 0.1 10*3/uL (ref 0.0–0.1)
Basophils Relative: 0.5 % (ref 0.0–3.0)
Eosinophils Absolute: 0.2 10*3/uL (ref 0.0–0.7)
Eosinophils Relative: 2.2 % (ref 0.0–5.0)
HCT: 38.6 % (ref 36.0–46.0)
Hemoglobin: 13.1 g/dL (ref 12.0–15.0)
Lymphocytes Relative: 30.5 % (ref 12.0–46.0)
Lymphs Abs: 3.2 10*3/uL (ref 0.7–4.0)
MCHC: 33.9 g/dL (ref 30.0–36.0)
MCV: 86.6 fl (ref 78.0–100.0)
Monocytes Absolute: 0.5 10*3/uL (ref 0.1–1.0)
Monocytes Relative: 4.3 % (ref 3.0–12.0)
Neutro Abs: 6.6 10*3/uL (ref 1.4–7.7)
Neutrophils Relative %: 62.5 % (ref 43.0–77.0)
Platelets: 325 10*3/uL (ref 150.0–400.0)
RBC: 4.46 Mil/uL (ref 3.87–5.11)
RDW: 12.6 % (ref 11.5–15.5)
WBC: 10.5 10*3/uL (ref 4.0–10.5)

## 2022-09-01 DIAGNOSIS — K921 Melena: Secondary | ICD-10-CM | POA: Diagnosis not present

## 2022-11-12 ENCOUNTER — Ambulatory Visit (INDEPENDENT_AMBULATORY_CARE_PROVIDER_SITE_OTHER): Payer: BC Managed Care – PPO | Admitting: Nurse Practitioner

## 2022-11-12 ENCOUNTER — Encounter: Payer: Self-pay | Admitting: Nurse Practitioner

## 2022-11-12 VITALS — BP 112/76 | HR 78 | Temp 97.6°F | Ht 64.0 in | Wt 239.0 lb

## 2022-11-12 DIAGNOSIS — H6992 Unspecified Eustachian tube disorder, left ear: Secondary | ICD-10-CM | POA: Diagnosis not present

## 2022-11-12 DIAGNOSIS — R062 Wheezing: Secondary | ICD-10-CM | POA: Insufficient documentation

## 2022-11-12 DIAGNOSIS — J069 Acute upper respiratory infection, unspecified: Secondary | ICD-10-CM | POA: Diagnosis not present

## 2022-11-12 DIAGNOSIS — R051 Acute cough: Secondary | ICD-10-CM | POA: Insufficient documentation

## 2022-11-12 MED ORDER — AZITHROMYCIN 250 MG PO TABS: ORAL_TABLET | ORAL | 0 refills | Status: AC

## 2022-11-12 MED ORDER — ALBUTEROL SULFATE HFA 108 (90 BASE) MCG/ACT IN AERS: 2.0000 | INHALATION_SPRAY | Freq: Four times a day (QID) | RESPIRATORY_TRACT | 0 refills | Status: DC | PRN

## 2022-11-12 MED ORDER — BENZONATATE 200 MG PO CAPS: 200.0000 mg | ORAL_CAPSULE | Freq: Three times a day (TID) | ORAL | 0 refills | Status: AC | PRN

## 2022-11-12 NOTE — Progress Notes (Signed)
Acute Office Visit  Subjective:     Patient ID: ALLIX BLOMQUIST, female    DOB: 1991/04/11, 31 y.o.   MRN: [id number]  Chief Complaint  Patient presents with   Acute Visit    Cough and congestion x 3 weeks. Patient took covid test this am and was negative. She has been taking sudafed, mucinex and delsym for sx.    HPI Patient is in today for cough  Symptoms started approx 3 weeks ago States that her husband had a cold the week before. States that he took over the counter stuff and got better. States she got it and did the same was feeling better. States that she got a stomach bug last Monday and felt better on Wednesday and had congestion settle in thereafter Covid vaccine Flu shot up to date Works at a doctors office with adults Covid test at home this morning that was negative Has been taking pseudoephedrine, mucinex, and delsym for symptoms. States helps some but does not get it to resolution   Review of Systems  Constitutional:  Negative for chills and fever.       Appetite - normal Fluid doing well   HENT:  Negative for ear discharge, ear pain, sinus pain and sore throat.   Respiratory:  Positive for cough, sputum production and wheezing (with deep breaths). Negative for shortness of breath.   Musculoskeletal:  Negative for back pain and myalgias.  Neurological:  Negative for headaches.        Objective:    BP 112/76 (BP Location: Left Arm, Patient Position: Sitting, Cuff Size: Normal)   Pulse 78   Temp 97.6 F (36.4 C) (Temporal)   Ht 5\' 4"  (1.626 m)   Wt 239 lb (108.4 kg)   SpO2 98%   BMI 41.02 kg/m    Physical Exam Vitals and nursing note reviewed.  Constitutional:      Appearance: Normal appearance.  HENT:     Right Ear: Tympanic membrane, ear canal and external ear normal.     Left Ear: Ear canal and external ear normal.     Ears:     Comments: Clear fluid behind left TM    Mouth/Throat:     Mouth: Mucous membranes are moist.     Pharynx:  Oropharynx is clear. No posterior oropharyngeal erythema.  Cardiovascular:     Rate and Rhythm: Normal rate and regular rhythm.  Pulmonary:     Effort: Pulmonary effort is normal.     Breath sounds: Normal breath sounds.  Abdominal:     General: Bowel sounds are normal.  Lymphadenopathy:     Cervical: No cervical adenopathy.  Neurological:     Mental Status: She is alert.     No results found for any visits on 11/12/22.      Assessment & Plan:   Problem List Items Addressed This Visit       Respiratory   Upper respiratory tract infection - Primary    Given the symptoms and physical exam oxygen with azithromycin 250 mg pack as directed.  Follow-up if no improvement  Patient did at home COVID test this a.m. that was negative.      Relevant Medications   azithromycin (ZITHROMAX) 250 MG tablet     Nervous and Auditory   Eustachian tube dysfunction, left    Continue using fluticasone nasal spray as directed add on second-generation antihistamine.  List provided at discharge of medications to get over-the-counter.  Other   Wheezing   Relevant Medications   albuterol (VENTOLIN HFA) 108 (90 Base) MCG/ACT inhaler   Acute cough    COVID test negative at home this morning.  Will send in Tessalon Perles 200 mg 3 times daily as needed cough.  Follow-up if no improvement.      Relevant Medications   benzonatate (TESSALON) 200 MG capsule    Meds ordered this encounter  Medications   albuterol (VENTOLIN HFA) 108 (90 Base) MCG/ACT inhaler    Sig: Inhale 2 puffs into the lungs every 6 (six) hours as needed for wheezing or shortness of breath.    Dispense:  8 g    Refill:  0    Order Specific Question:   Supervising Provider    Answer:   Roxy Manns A [[id number]]   azithromycin (ZITHROMAX) 250 MG tablet    Sig: Take 2 tablets on day 1, then 1 tablet daily on days 2 through 5    Dispense:  6 tablet    Refill:  0    Order Specific Question:   Supervising Provider     Answer:   Roxy Manns A [[id number]]   benzonatate (TESSALON) 200 MG capsule    Sig: Take 1 capsule (200 mg total) by mouth 3 (three) times daily as needed for up to 7 days for cough.    Dispense:  21 capsule    Refill:  0    Order Specific Question:   Supervising Provider    Answer:   TOWER, MARNE A [[id number]]    Return if symptoms worsen or fail to improve.  Audria Nine, NP

## 2022-11-12 NOTE — Assessment & Plan Note (Addendum)
Given the symptoms and physical exam oxygen with azithromycin 250 mg pack as directed.  Follow-up if no improvement  Patient did at home COVID test this a.m. that was negative.

## 2022-11-12 NOTE — Assessment & Plan Note (Signed)
COVID test negative at home this morning.  Will send in Tessalon Perles 200 mg 3 times daily as needed cough.  Follow-up if no improvement.

## 2022-11-12 NOTE — Patient Instructions (Signed)
Nice to see you today You can pick up an over the counter antihistamine like: Claritin, Allegra, Zyrtec, or Xyzal to help with the symptoms Follow up if no improvement

## 2022-11-12 NOTE — Assessment & Plan Note (Signed)
Continue using fluticasone nasal spray as directed add on second-generation antihistamine.  List provided at discharge of medications to get over-the-counter.

## 2022-11-25 ENCOUNTER — Telehealth: Payer: Self-pay | Admitting: Family

## 2022-11-25 NOTE — Telephone Encounter (Signed)
Please notify patient that we are sorry to hear about this! Depending on her symptoms there are several options OTC:   Cough: Delsym or Robitussin (get the off brand, works just as well) Chest Congestion: Mucinex (plain) Nasal Congestion/Ear Pressure/Sinus Pressure: Try using Flonase (fluticasone) nasal spray. Instill 1 spray in each nostril twice daily. This can be purchased over the counter. Body aches, fevers, headache: Ibuprofen (not to exceed 2400 mg in 24 hours) or Acetaminophen-Tylenol (not to exceed 3000 mg in 24 hours) Runny Nose/Throat Drainage/Sneezing/Itchy or Watery Eyes: An antihistamine such as Zyrtec, Claritin, Xyzal, Allegra

## 2022-11-25 NOTE — Telephone Encounter (Signed)
Patient received a flu test from her job and tested positve for flu A . She would like any recommendations on anything that she can take otc that will help her.Pleas advise.

## 2022-11-25 NOTE — Telephone Encounter (Signed)
Called and informed pt of this information as well as sent her a mychart with this information.

## 2022-12-05 ENCOUNTER — Encounter: Payer: Self-pay | Admitting: Nurse Practitioner

## 2022-12-17 ENCOUNTER — Ambulatory Visit (INDEPENDENT_AMBULATORY_CARE_PROVIDER_SITE_OTHER): Payer: BC Managed Care – PPO | Admitting: Family

## 2022-12-17 ENCOUNTER — Encounter: Payer: Self-pay | Admitting: Family

## 2022-12-17 VITALS — BP 118/74 | HR 78 | Temp 98.7°F | Ht 64.0 in | Wt 238.6 lb

## 2022-12-17 DIAGNOSIS — H6691 Otitis media, unspecified, right ear: Secondary | ICD-10-CM

## 2022-12-17 MED ORDER — CEFDINIR 300 MG PO CAPS: 300.0000 mg | ORAL_CAPSULE | Freq: Two times a day (BID) | ORAL | 0 refills | Status: AC

## 2022-12-17 NOTE — Progress Notes (Addendum)
Established Patient Office Visit  Subjective:   Patient ID: Michele Burnett, female    DOB: [birth year]  Age: 32 y.o. MRN: [id number]  CC:  Chief Complaint  Patient presents with   Cough    Cough started in November that comes and goes.   Nasal Congestion    Congestion started Sunday. Flu positive the week of Christmas. No Covid testing. No fevers    HPI: Michele Burnett is a 32 y.o. female presenting on 12/17/2022 for Cough (Cough started in November that comes and goes.) and Nasal Congestion (Congestion started Sunday. Flu positive the week of Christmas. No Covid testing. No fevers)     Cough    Four days ago started with nasal congestion, bil fluid in ears, cough productive with yellow sputum, and no sinus pressure. Slight sore throat. No wheezing, no fever, no chills.   Taking xyzal otc since symptoms started and also flonase.    ROS: Negative unless specifically indicated above in HPI.   Relevant past medical history reviewed and updated as indicated.   Allergies and medications reviewed and updated.   Current Outpatient Medications:    albuterol (VENTOLIN HFA) 108 (90 Base) MCG/ACT inhaler, Inhale 2 puffs into the lungs every 6 (six) hours as needed for wheezing or shortness of breath., Disp: 8 g, Rfl: 0   cefdinir (OMNICEF) 300 MG capsule, Take 1 capsule (300 mg total) by mouth 2 (two) times daily for 10 days., Disp: 20 capsule, Rfl: 0   escitalopram (LEXAPRO) 10 MG tablet, Take 1 tablet (10 mg total) by mouth daily., Disp: 90 tablet, Rfl: 3   fluticasone (FLONASE) 50 MCG/ACT nasal spray, Place 2 sprays into both nostrils daily., Disp: 16 g, Rfl: 6   levonorgestrel (MIRENA) 20 MCG/24HR IUD, 1 each by Intrauterine route once., Disp: , Rfl:   Allergies  Allergen Reactions   Amoxicillin     Other reaction(s): Vomiting    Objective:   BP 118/74   Pulse 78   Temp 98.7 F (37.1 C) (Oral)   Ht 5\' 4"  (1.626 m)   Wt 238 lb 9.6 oz (108.2 kg)   SpO2 99%   BMI 40.96  kg/m    Physical Exam Constitutional:      General: She is not in acute distress.    Appearance: Normal appearance. She is obese. She is not ill-appearing, toxic-appearing or diaphoretic.  HENT:     Head: Normocephalic.     Right Ear: No middle ear effusion. Tympanic membrane is erythematous and bulging (mild).     Left Ear: Tympanic membrane normal.  No middle ear effusion. Tympanic membrane is not erythematous.     Nose: Nose normal.     Mouth/Throat:     Mouth: Mucous membranes are dry.     Pharynx: No oropharyngeal exudate or posterior oropharyngeal erythema.  Eyes:     Extraocular Movements: Extraocular movements intact.     Pupils: Pupils are equal, round, and reactive to light.  Cardiovascular:     Rate and Rhythm: Normal rate and regular rhythm.     Pulses: Normal pulses.     Heart sounds: Normal heart sounds.  Pulmonary:     Effort: Pulmonary effort is normal.     Breath sounds: Normal breath sounds.  Musculoskeletal:     Cervical back: Normal range of motion.  Neurological:     General: No focal deficit present.     Mental Status: She is alert and oriented to person, place, and time. Mental  status is at baseline.  Psychiatric:        Mood and Affect: Mood normal.        Behavior: Behavior normal.        Thought Content: Thought content normal.        Judgment: Judgment normal.     Assessment & Plan:  Right otitis media, unspecified otitis media type Assessment & Plan: Take antibiotic as prescribed. Increase oral fluids. Pt to f/u if sx worsen and or fail to improve in 2-3 days. Rx cefdinir 300 mg po bid x 10 days  Orders: -     Cefdinir; Take 1 capsule (300 mg total) by mouth 2 (two) times daily for 10 days.  Dispense: 20 capsule; Refill: 0     Follow up plan: No follow-ups on file.  Eugenia Pancoast, FNP

## 2022-12-17 NOTE — Assessment & Plan Note (Signed)
Take antibiotic as prescribed. Increase oral fluids. Pt to f/u if sx worsen and or fail to improve in 2-3 days. Rx cefdinir 300 mg po bid x 10 days  

## 2022-12-24 ENCOUNTER — Encounter: Payer: Self-pay | Admitting: Family

## 2022-12-24 DIAGNOSIS — B3731 Acute candidiasis of vulva and vagina: Secondary | ICD-10-CM

## 2022-12-24 MED ORDER — FLUCONAZOLE 150 MG PO TABS: ORAL_TABLET | ORAL | 0 refills | Status: DC

## 2022-12-24 NOTE — Telephone Encounter (Signed)
I spoke with pt;Pt was seen on 12/17/22 and pt has 2 1/2 days left of abx to take. Pt said she has white vaginal discharge with slight itching at opening of vagina and most of itching is perineal area. Pt said she got Monistat 3 and today is day 2 of monistat with no relief from itching and is extremely messy to work with. Pt request diflucan sent to [facility]. Sending note to Red Christians FNP.

## 2023-01-02 ENCOUNTER — Encounter: Payer: Self-pay | Admitting: Nurse Practitioner

## 2023-01-02 ENCOUNTER — Encounter: Payer: Self-pay | Admitting: Family

## 2023-01-02 ENCOUNTER — Telehealth (INDEPENDENT_AMBULATORY_CARE_PROVIDER_SITE_OTHER): Payer: BC Managed Care – PPO | Admitting: Nurse Practitioner

## 2023-01-02 VITALS — Ht 64.0 in | Wt 239.0 lb

## 2023-01-02 DIAGNOSIS — U071 COVID-19: Secondary | ICD-10-CM | POA: Insufficient documentation

## 2023-01-02 MED ORDER — NIRMATRELVIR/RITONAVIR (PAXLOVID)TABLET: 3.0000 | ORAL_TABLET | Freq: Two times a day (BID) | ORAL | 0 refills | Status: AC

## 2023-01-02 NOTE — Assessment & Plan Note (Signed)
COVID positive at home.  Discussed antiviral and that is EUA only.  Will pursue Paxlovid.  Discussed signs and symptoms when to seek urgent emergent healthcare.  Discussed common side effects of Paxlovid use.  Did discuss CDC guidelines/recommendations in regards to self-isolation/quarantine.  Patient will hold Flonase while on medication.  Follow-up if no improvement

## 2023-01-02 NOTE — Patient Instructions (Addendum)
You need to quarantine through 01/06/2023 On 01/07/2023 if you have been fever free for 24 hours and feeling better you can come out of quarantine but need to wear a mask for 5 days through 01/11/2023  DO NOT use flonase while on paxlovid  Paxlovid can decrease your birth controls effectiveness while on the medication so use back up contraception if needed

## 2023-01-02 NOTE — Telephone Encounter (Signed)
Patient scheduled for a video visit now with Romilda Garret NP.

## 2023-01-02 NOTE — Progress Notes (Signed)
Patient ID: Michele Burnett, female    DOB: 1990/12/29, 32 y.o.   MRN: [id number]  Virtual visit completed through Black, a video enabled telemedicine application. Due to national recommendations of social distancing due to COVID-19, a virtual visit is felt to be most appropriate for this patient at this time. Reviewed limitations, risks, security and privacy concerns of performing a virtual visit and the availability of in person appointments. I also reviewed that there may be a patient responsible charge related to this service. The patient agreed to proceed.   Patient location: home Provider location: [place] at [facility], office Persons participating in this virtual visit: patient, provider   If any vitals were documented, they were collected by patient at home unless specified below.    Ht 5\' 4"  (1.626 m)   Wt 239 lb (108.4 kg)   BMI 41.02 kg/m    CC: Covid 19 Subjective:   HPI: Michele Burnett is a 32 y.o. female presenting on 01/02/2023 for Covid Positive (POS today), Cough (X2d, denies shob/wheeze), Nasal Congestion, and Sore Throat   Symptoms stared on 01/01/2023 Covid positive test today  Covid vaccine: pfizer x2 Flu vaccine utd Sick contacts. Works in a Teacher, music. Several coworkers are sick with Covid  States tat she has taken cold and lu medication. That did not help   Relevant past medical, surgical, family and social history reviewed and updated as indicated. Interim medical history since our last visit reviewed. Allergies and medications reviewed and updated. Outpatient Medications Prior to Visit  Medication Sig Dispense Refill   albuterol (VENTOLIN HFA) 108 (90 Base) MCG/ACT inhaler Inhale 2 puffs into the lungs every 6 (six) hours as needed for wheezing or shortness of breath. 8 g 0   escitalopram (LEXAPRO) 10 MG tablet Take 1 tablet (10 mg total) by mouth daily. 90 tablet 3   fluticasone (FLONASE) 50 MCG/ACT nasal spray Place 2 sprays into both nostrils  daily. 16 g 6   levonorgestrel (MIRENA) 20 MCG/24HR IUD 1 each by Intrauterine route once.     fluconazole (DIFLUCAN) 150 MG tablet Take one po qd for one dose, then repeat in three days if still with symptoms 2 tablet 0   No facility-administered medications prior to visit.     Per HPI unless specifically indicated in ROS section below  Review of Systems  Constitutional:  Positive for appetite change, fatigue and fever. Negative for chills.       Fluid intake is good  HENT:  Positive for ear pain, sinus pain and sore throat. Negative for ear discharge.   Respiratory:  Positive for cough. Negative for shortness of breath.   Gastrointestinal:  Negative for abdominal pain, diarrhea and nausea.  Musculoskeletal:  Negative for arthralgias and neck pain.  Neurological:  Positive for headaches.   Objective:  Ht 5\' 4"  (1.626 m)   Wt 239 lb (108.4 kg)   BMI 41.02 kg/m   Wt Readings from Last 3 Encounters:  01/02/23 239 lb (108.4 kg)  12/17/22 238 lb 9.6 oz (108.2 kg)  11/12/22 239 lb (108.4 kg)       Physical exam: Gen: alert, NAD, not ill appearing Pulm: speaks in complete sentences without increased work of breathing Psych: normal mood, normal thought content      Results for orders placed or performed in visit on 08/25/22  CBC with Differential  Result Value Ref Range   WBC 10.5 4.0 - 10.5 K/uL   RBC 4.46 3.87 - 5.11  Mil/uL   Hemoglobin 13.1 12.0 - 15.0 g/dL   HCT 38.6 36.0 - 46.0 %   MCV 86.6 78.0 - 100.0 fl   MCHC 33.9 30.0 - 36.0 g/dL   RDW 12.6 11.5 - 15.5 %   Platelets 325.0 150.0 - 400.0 K/uL   Neutrophils Relative % 62.5 43.0 - 77.0 %   Lymphocytes Relative 30.5 12.0 - 46.0 %   Monocytes Relative 4.3 3.0 - 12.0 %   Eosinophils Relative 2.2 0.0 - 5.0 %   Basophils Relative 0.5 0.0 - 3.0 %   Neutro Abs 6.6 1.4 - 7.7 K/uL   Lymphs Abs 3.2 0.7 - 4.0 K/uL   Monocytes Absolute 0.5 0.1 - 1.0 K/uL   Eosinophils Absolute 0.2 0.0 - 0.7 K/uL   Basophils Absolute 0.1 0.0  - 0.1 K/uL   Assessment & Plan:   COVID-19 Assessment & Plan: COVID positive at home.  Discussed antiviral and that is EUA only.  Will pursue Paxlovid.  Discussed signs and symptoms when to seek urgent emergent healthcare.  Discussed common side effects of Paxlovid use.  Did discuss CDC guidelines/recommendations in regards to self-isolation/quarantine.  Patient will hold Flonase while on medication.  Follow-up if no improvement  Orders: -     nirmatrelvir/ritonavir; Take 3 tablets by mouth 2 (two) times daily for 5 days. (Take nirmatrelvir 150 mg two tablets twice daily for 5 days and ritonavir 100 mg one tablet twice daily for 5 days) Patient GFR is 120  Dispense: 30 tablet; Refill: 0     I discussed the assessment and treatment plan with the patient. The patient was provided an opportunity to ask questions and all were answered. The patient agreed with the plan and demonstrated an understanding of the instructions. The patient was advised to call back or seek an in-person evaluation if the symptoms worsen or if the condition fails to improve as anticipated.  Follow up plan: Return if symptoms worsen or fail to improve.  Romilda Garret, NP

## 2023-02-24 ENCOUNTER — Ambulatory Visit: Payer: BC Managed Care – PPO | Admitting: Family Medicine

## 2023-03-10 ENCOUNTER — Ambulatory Visit (INDEPENDENT_AMBULATORY_CARE_PROVIDER_SITE_OTHER): Payer: BC Managed Care – PPO | Admitting: Family Medicine

## 2023-03-10 ENCOUNTER — Encounter: Payer: Self-pay | Admitting: Family Medicine

## 2023-03-10 VITALS — BP 108/68 | HR 78 | Temp 97.8°F | Ht 64.0 in | Wt 242.4 lb

## 2023-03-10 DIAGNOSIS — L089 Local infection of the skin and subcutaneous tissue, unspecified: Secondary | ICD-10-CM | POA: Diagnosis not present

## 2023-03-10 DIAGNOSIS — L918 Other hypertrophic disorders of the skin: Secondary | ICD-10-CM

## 2023-03-10 MED ORDER — CEPHALEXIN 500 MG PO CAPS: 500.0000 mg | ORAL_CAPSULE | Freq: Three times a day (TID) | ORAL | 0 refills | Status: DC

## 2023-03-10 NOTE — Assessment & Plan Note (Signed)
Moderate size Pt tried to remove with freezing kit Possible infection now   Tx with keflex Once area is calmed down will f/u for skin tag removal (bothersome area with friction between thighs)

## 2023-03-10 NOTE — Patient Instructions (Addendum)
Keep area clean and dry (soap and water) Cover to protect from friction   Bike shorts with gauze pad may be better than band aid  Antibiotic ointment or aquaphor is ok also   Take keflex as directed   Follow up with Tabitha in about a month to remove skin tag  Watch for increased redness/ size/pain/drainage

## 2023-03-10 NOTE — Progress Notes (Signed)
Subjective:    Patient ID: Michele Burnett, female    DOB: [birth year], 32 y.o.   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 of NP Dugal presents with inflamed skin tag   Wt Readings from Last 3 Encounters:  03/10/23 242 lb 6 oz (109.9 kg)  01/02/23 239 lb (108.4 kg)  12/17/22 238 lb 9.6 oz (108.2 kg)   41.60 kg/m  Vitals:   03/10/23 1432  BP: 108/68  Pulse: 78  Temp: 97.8 F (36.6 C)  SpO2: 98%   Inside of thigh  Used an otc kit to freeze it  Using band aid to help friction  Bad spot-it sweats   Was oozing on Sunday  Then bled some  Keeping it clean   Concerned about infection  Quite sore   No fever No nausea   Patient Active Problem List   Diagnosis Date Noted   Skin infection 03/10/2023   Skin tag 03/10/2023   COVID-19 01/02/2023   Right otitis media 12/17/2022   Eustachian tube dysfunction, left 11/12/2022   Disorder of right eustachian tube 08/25/2022   Mixed hyperlipidemia 02/20/2022   Snoring 02/20/2022   BMI 40.0-44.9, adult 02/20/2022   Generalized anxiety disorder 09/26/2021   Morbid obesity with BMI of 40.0-44.9, adult 01/31/2019   IUD (intrauterine device) in place 02/19/2017   Past Medical History:  Diagnosis Date   Fracture 7-12   RIGHT FOOT   History of chickenpox    History of shingles    Ovarian cyst rupture 12-26-11   Sinus infection 012513   ALSO DIAGNOSED TONSILLITIS   Tonsil stone    Past Surgical History:  Procedure Laterality Date   INTRAUTERINE DEVICE INSERTION  02/19/2016   Mirena   WISDOM TOOTH EXTRACTION     Social History   Tobacco Use   Smoking status: Never   Smokeless tobacco: Never  Vaping Use   Vaping Use: Never used  Substance Use Topics   Alcohol use: Yes    Comment: RARE-once a month   Drug use: Never   Family History  Problem Relation Age of Onset   Diabetes Mother    Miscarriages / Stillbirths Mother    Hypertension Maternal Uncle    Diabetes Maternal Grandmother    Hypertension Maternal Grandmother     Hypertension Maternal Grandfather    Brain cancer Maternal Grandfather        with mets to lung, liver, kidney   Allergies  Allergen Reactions   Amoxicillin     Other reaction(s): Vomiting   Current Outpatient Medications on File Prior to Visit  Medication Sig Dispense Refill   albuterol (VENTOLIN HFA) 108 (90 Base) MCG/ACT inhaler Inhale 2 puffs into the lungs every 6 (six) hours as needed for wheezing or shortness of breath. 8 g 0   escitalopram (LEXAPRO) 10 MG tablet Take 1 tablet (10 mg total) by mouth daily. 90 tablet 3   fluticasone (FLONASE) 50 MCG/ACT nasal spray Place 2 sprays into both nostrils daily. 16 g 6   levonorgestrel (MIRENA) 20 MCG/24HR IUD 1 each by Intrauterine route once.     No current facility-administered medications on file prior to visit.        Review of Systems  Constitutional:  Negative for chills, fatigue and fever.  Respiratory:  Negative for cough.   Gastrointestinal:  Negative for abdominal pain and nausea.  Musculoskeletal:  Negative for arthralgias.  Skin:  Positive for color change and wound.  Neurological:  Negative for headaches.  Objective:   Physical Exam Constitutional:      General: She is not in acute distress.    Appearance: Normal appearance. She is obese. She is not ill-appearing or diaphoretic.  HENT:     Head: Normocephalic and atraumatic.  Cardiovascular:     Rate and Rhythm: Normal rate and regular rhythm.     Heart sounds: Normal heart sounds.  Pulmonary:     Effort: Pulmonary effort is normal. No respiratory distress.  Skin:    Findings: Erythema and lesion present. No bruising.     Comments: 3-4 mm skin tag on R inner thigh  Is abraded/ draining scant yellow fluid  Surrounded by 1 cm collar of erythema  No streaks/ no satellite lesions No fluctuance or abscess formation noted  Is tender     Neurological:     Mental Status: She is alert.     Sensory: No sensory deficit.  Psychiatric:        Mood and  Affect: Mood normal.           Assessment & Plan:   Problem List Items Addressed This Visit       Musculoskeletal and Integument   Skin infection - Primary    Pt tried to remove a skin tag on R inner thigh  Now area is red/draining/painful  Reviewed chart-no h/o past skin infx or mrsa  Px keflex tid for 7d  Keep clean with soap water Antibact ointm if needed/cover lightly or use bike shorts and gauze  Update if not starting to improve in a week or if worsening  ER precautions given  Once resolved will f/u for skin tag removal       Relevant Medications   cephALEXin (KEFLEX) 500 MG capsule   Skin tag    Moderate size Pt tried to remove with freezing kit Possible infection now   Tx with keflex Once area is calmed down will f/u for skin tag removal (bothersome area with friction between thighs)

## 2023-03-10 NOTE — Assessment & Plan Note (Signed)
Pt tried to remove a skin tag on R inner thigh  Now area is red/draining/painful  Reviewed chart-no h/o past skin infx or mrsa  Px keflex tid for 7d  Keep clean with soap water Antibact ointm if needed/cover lightly or use bike shorts and gauze  Update if not starting to improve in a week or if worsening  ER precautions given  Once resolved will f/u for skin tag removal

## 2023-03-12 ENCOUNTER — Telehealth: Payer: Self-pay | Admitting: Family

## 2023-03-12 MED ORDER — FLUCONAZOLE 150 MG PO TABS: 150.0000 mg | ORAL_TABLET | Freq: Once | ORAL | 0 refills | Status: AC

## 2023-03-12 NOTE — Telephone Encounter (Signed)
Patient called in and stated that she seen Dr. Milinda Antis and was prescribed an antibiotic. She stated that she is know experiencing a yeast infection and was wondering if she could call something in for it. Please advise. Thank you!

## 2023-03-12 NOTE — Telephone Encounter (Signed)
I sent in oral diflucan  If not improved in 3-4 days please follow up   A probiotic otc may help also

## 2023-03-12 NOTE — Telephone Encounter (Signed)
Pt notified Rx sent to pharmacy and I also advised pt of Dr. Royden Purl comments

## 2023-04-06 DIAGNOSIS — M25532 Pain in left wrist: Secondary | ICD-10-CM | POA: Diagnosis not present

## 2023-04-09 ENCOUNTER — Ambulatory Visit: Payer: BC Managed Care – PPO | Admitting: Family

## 2023-04-30 ENCOUNTER — Other Ambulatory Visit (HOSPITAL_COMMUNITY)
Admission: RE | Admit: 2023-04-30 | Discharge: 2023-04-30 | Disposition: A | Discharge status: Home / Self Care | Payer: BC Managed Care – PPO | Source: Ambulatory Visit | Attending: Nurse Practitioner | Admitting: Nurse Practitioner

## 2023-04-30 ENCOUNTER — Encounter: Payer: Self-pay | Admitting: Nurse Practitioner

## 2023-04-30 ENCOUNTER — Ambulatory Visit (INDEPENDENT_AMBULATORY_CARE_PROVIDER_SITE_OTHER): Payer: BC Managed Care – PPO | Admitting: Nurse Practitioner

## 2023-04-30 VITALS — BP 124/76 | HR 86 | Ht 64.0 in | Wt 241.0 lb

## 2023-04-30 DIAGNOSIS — Z124 Encounter for screening for malignant neoplasm of cervix: Secondary | ICD-10-CM | POA: Diagnosis not present

## 2023-04-30 DIAGNOSIS — Z01419 Encounter for gynecological examination (general) (routine) without abnormal findings: Secondary | ICD-10-CM | POA: Diagnosis not present

## 2023-04-30 DIAGNOSIS — Z30431 Encounter for routine checking of intrauterine contraceptive device: Secondary | ICD-10-CM

## 2023-04-30 NOTE — Progress Notes (Signed)
   Michele Burnett 25-Jul-1991 [id number]   History:  32 y.o. G0 presents for annual exam. Occasional spotting with Mirena IUD, inserted 01/2016. Correct IUD position confirmed by [place] 05/2022 after string not visualized on exam. Normal pap history. Has received Gardasil series.   Gynecologic History No LMP recorded. (Menstrual status: IUD).   Contraception/Family planning: IUD Sexually active: Yes  Health Maintenance Last Pap: 04/03/2020. Results were: Normal Last mammogram: Not indicated Last colonoscopy: No indicated Last Dexa: Not indicated  Past medical history, past surgical history, family history and social history were all reviewed and documented in the EPIC chart. 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   ROS:  A ROS was performed and pertinent positives and negatives are included.  Exam:  Vitals:   04/30/23 1355  BP: 124/76  Pulse: 86  SpO2: 98%  Weight: 241 lb (109.3 kg)  Height: 5\' 4"  (1.626 m)     Body mass index is 41.37 kg/m.  General appearance:  Normal Thyroid:  Symmetrical, normal in size, without palpable masses or nodularity. Respiratory  Auscultation:  Clear without wheezing or rhonchi Cardiovascular  Auscultation:  Regular rate, without rubs, murmurs or gallops  Edema/varicosities:  Not grossly evident Abdominal  Soft,nontender, without masses, guarding or rebound.  Liver/spleen:  No organomegaly noted  Hernia:  None appreciated  Skin  Inspection:  Grossly normal Breasts: Examined lying and sitting.   Right: Without masses, retractions, nipple discharge or axillary adenopathy.   Left: Without masses, retractions, nipple discharge or axillary adenopathy. Genitourinary   Inguinal/mons:  Normal without inguinal adenopathy  External genitalia:  Normal appearing vulva with no masses, tenderness, or lesions  BUS/Urethra/Skene's glands:  Normal  Vagina:  Normal appearing with normal color and discharge, no lesions  Cervix:  Normal appearing  without discharge or lesions. IUD string not visualized  Uterus:  Normal in size, shape and contour.  Midline and mobile, nontender  Adnexa/parametria:     Rt: Normal in size, without masses or tenderness.   Lt: Normal in size, without masses or tenderness.  Anus and perineum: Normal  Patient informed chaperone available to be present for breast and pelvic exam. Patient has requested no chaperone to be present. Patient has been advised what will be completed during breast and pelvic exam.   Assessment/Plan:  32 y.o. G0 for annual exam.   Well female exam with routine gynecological exam - Education provided on SBEs, importance of preventative screenings, current guidelines, high calcium diet, regular exercise, and multivitamin daily. Labs with PCP.  Screening for cervical cancer - Plan: Cytology - PAP( [facility]). Normal pap history.   Encounter for routine checking of intrauterine contraceptive device (IUD) - Mirena IUD inserted 01/2016. Will return for exchange in March 2025. Occasional spotting. Correct IUD position confirmed by [place] 05/2022 after string not visualized on exam.  Return in 1 year for annual.    [name] DNP, 2:12 PM 04/30/2023

## 2023-05-05 LAB — CYTOLOGY - PAP
Adequacy: ABSENT
Comment: NEGATIVE
Diagnosis: NEGATIVE
High risk HPV: NEGATIVE

## 2023-05-25 ENCOUNTER — Other Ambulatory Visit: Payer: Self-pay | Admitting: Nurse Practitioner

## 2023-05-25 DIAGNOSIS — R062 Wheezing: Secondary | ICD-10-CM

## 2023-09-10 DIAGNOSIS — D485 Neoplasm of uncertain behavior of skin: Secondary | ICD-10-CM | POA: Diagnosis not present

## 2023-09-10 DIAGNOSIS — D225 Melanocytic nevi of trunk: Secondary | ICD-10-CM | POA: Diagnosis not present

## 2023-09-10 DIAGNOSIS — Z1283 Encounter for screening for malignant neoplasm of skin: Secondary | ICD-10-CM | POA: Diagnosis not present

## 2023-09-10 DIAGNOSIS — D224 Melanocytic nevi of scalp and neck: Secondary | ICD-10-CM | POA: Diagnosis not present

## 2023-10-04 ENCOUNTER — Ambulatory Visit
Admission: EM | Admit: 2023-10-04 | Discharge: 2023-10-04 | Disposition: A | Discharge status: Home / Self Care | Payer: BC Managed Care – PPO | Attending: Emergency Medicine | Admitting: Emergency Medicine

## 2023-10-04 DIAGNOSIS — B349 Viral infection, unspecified: Secondary | ICD-10-CM

## 2023-10-04 LAB — POC COVID19/FLU A&B COMBO
Covid Antigen, POC: NEGATIVE
Influenza A Antigen, POC: NEGATIVE
Influenza B Antigen, POC: NEGATIVE

## 2023-10-04 NOTE — ED Provider Notes (Signed)
Renaldo Fiddler    CSN: [id number] Arrival date & time: 10/04/23  0809      History   Chief Complaint Chief Complaint  Patient presents with   Fever   Nasal Congestion    HPI Michele Burnett is a 32 y.o. female.  Patient presents with fever, body aches, fatigue, congestion, postnasal drip, cough since yesterday.  Tylenol taken this morning at 0700.  She has an albuterol inhaler that was prescribed last year for previous illness and used that yesterday.  No chest pain, shortness of breath, vomiting, diarrhea, or other symptoms.  No history of asthma or lung disease.  The history is provided by the patient and medical records.    Past Medical History:  Diagnosis Date   Fracture 7-12   RIGHT FOOT   History of chickenpox    History of shingles    Ovarian cyst rupture 12-26-11   Sinus infection 012513   ALSO DIAGNOSED TONSILLITIS   Tonsil stone     Patient Active Problem List   Diagnosis Date Noted   Skin infection 03/10/2023   Skin tag 03/10/2023   COVID-19 01/02/2023   Right otitis media 12/17/2022   Eustachian tube dysfunction, left 11/12/2022   Disorder of right eustachian tube 08/25/2022   Mixed hyperlipidemia 02/20/2022   Snoring 02/20/2022   BMI 40.0-44.9, adult (HCC) 02/20/2022   Generalized anxiety disorder 09/26/2021   Morbid obesity with BMI of 40.0-44.9, adult (HCC) 01/31/2019   IUD (intrauterine device) in place 02/19/2017    Past Surgical History:  Procedure Laterality Date   INTRAUTERINE DEVICE INSERTION  02/19/2016   Mirena   MOLE REMOVAL     scalp, october 2024   WISDOM TOOTH EXTRACTION      OB History     Gravida  0   Para      Term      Preterm      AB      Living         SAB      IAB      Ectopic      Multiple      Live Births               Home Medications    Prior to Admission medications   Medication Sig Start Date End Date Taking? Authorizing Provider  albuterol (VENTOLIN HFA) 108 (90 Base) MCG/ACT  inhaler INHALE 2 PUFFS BY MOUTH EVERY 6 HOURS AS NEEDED FOR WHEEZE OR SHORTNESS OF BREATH 05/25/23   Mort Sawyers, FNP  escitalopram (LEXAPRO) 10 MG tablet Take 1 tablet (10 mg total) by mouth daily. 08/25/22   Mort Sawyers, FNP  fluticasone (FLONASE) 50 MCG/ACT nasal spray Place 2 sprays into both nostrils daily. 11/04/19   Emi Belfast, FNP  levonorgestrel (MIRENA) 20 MCG/24HR IUD 1 each by Intrauterine route once.    [provider]    Family History Family History  Problem Relation Age of Onset   Diabetes Mother    Miscarriages / [place] Mother    Hypertension Maternal Uncle    Diabetes Maternal Grandmother    Hypertension Maternal Grandmother    Hypertension Maternal Grandfather    Brain cancer Maternal Grandfather        with mets to lung, liver, kidney    Social History Social History   Tobacco Use   Smoking status: Never   Smokeless tobacco: Never  Vaping Use   Vaping status: Never Used  Substance Use Topics   Alcohol  use: Yes    Comment: RARE-once a month   Drug use: Never     Allergies   Amoxicillin   Review of Systems Review of Systems  Constitutional:  Positive for chills, fatigue and fever.  HENT:  Positive for congestion, postnasal drip and rhinorrhea. Negative for ear pain and sore throat.   Respiratory:  Positive for cough. Negative for shortness of breath.   Cardiovascular:  Negative for chest pain and palpitations.  Gastrointestinal:  Negative for diarrhea and vomiting.     Physical Exam Triage Vital Signs ED Triage Vitals [10/04/23 0822]  Encounter Vitals Group     BP      Systolic BP Percentile      Diastolic BP Percentile      Pulse Rate (!) 115     Resp 18     Temp 99.6 F (37.6 C)     Temp src      SpO2 97 %     Weight      Height      Head Circumference      Peak Flow      Pain Score      Pain Loc      Pain Education      Exclude from Growth Chart    No data found.  Updated Vital Signs BP 109/76    Pulse (!) 115   Temp 99.6 F (37.6 C)   Resp 18   SpO2 97%   Visual Acuity Right Eye Distance:   Left Eye Distance:   Bilateral Distance:    Right Eye Near:   Left Eye Near:    Bilateral Near:     Physical Exam Constitutional:      General: She is not in acute distress.    Appearance: She is obese.  HENT:     Right Ear: Tympanic membrane normal.     Left Ear: Tympanic membrane normal.     Nose: Congestion present.     Mouth/Throat:     Mouth: Mucous membranes are moist.     Pharynx: Oropharynx is clear.  Cardiovascular:     Rate and Rhythm: Normal rate and regular rhythm.     Heart sounds: Normal heart sounds.  Pulmonary:     Effort: Pulmonary effort is normal. No respiratory distress.     Breath sounds: Normal breath sounds.  Skin:    General: Skin is warm and dry.  Neurological:     Mental Status: She is alert.      UC Treatments / Results  Labs (all labs ordered are listed, but only abnormal results are displayed) Labs Reviewed  POC COVID19/FLU A&B COMBO    EKG   Radiology No results found.  Procedures Procedures (including critical care time)  Medications Ordered in UC Medications - No data to display  Initial Impression / Assessment and Plan / UC Course  I have reviewed the triage vital signs and the nursing notes.  Pertinent labs & imaging results that were available during my care of the patient were reviewed by me and considered in my medical decision making (see chart for details).    Viral illness.  Rapid COVID and flu negative.  Discussed symptomatic treatment including Tylenol or ibuprofen as needed for fever or discomfort, plain Mucinex as needed for congestion, rest, hydration.  Instructed patient to follow-up with PCP if not improving.  ED precautions given.  Patient agrees to plan of care.    Final Clinical Impressions(s) / UC Diagnoses  Final diagnoses:  Viral illness     Discharge Instructions      Your COVID and flu  tests are negative.    Continue to use your albuterol inhaler as needed.  Take Tylenol or ibuprofen as needed for fever or discomfort.  Take plain Mucinex as needed for congestion.    Follow-up with your primary care provider if your symptoms are not improving.      ED Prescriptions   None    PDMP not reviewed this encounter.   Mickie Bail, NP 10/04/23 [phone]

## 2023-10-04 NOTE — Discharge Instructions (Addendum)
Your COVID and flu tests are negative.    Continue to use your albuterol inhaler as needed.  Take Tylenol or ibuprofen as needed for fever or discomfort.  Take plain Mucinex as needed for congestion.    Follow-up with your primary care provider if your symptoms are not improving.

## 2023-10-04 NOTE — ED Triage Notes (Addendum)
Patient to Urgent Care with complaints of fevers/ nasal congestion/ fatigue/ dry cough/ generalized body aches.  Reports symptoms started yesterday. Taking tylenol/ Mucinex/ Ibuprofen. Using her albuterol inhaler.

## 2023-10-05 ENCOUNTER — Encounter: Payer: Self-pay | Admitting: Family

## 2023-10-05 NOTE — Telephone Encounter (Signed)
error 

## 2023-10-06 ENCOUNTER — Ambulatory Visit: Payer: BC Managed Care – PPO | Admitting: Family

## 2023-10-06 VITALS — BP 104/68 | HR 102 | Temp 100.3°F | Ht 64.0 in | Wt 252.2 lb

## 2023-10-06 DIAGNOSIS — H6691 Otitis media, unspecified, right ear: Secondary | ICD-10-CM

## 2023-10-06 DIAGNOSIS — J029 Acute pharyngitis, unspecified: Secondary | ICD-10-CM | POA: Diagnosis not present

## 2023-10-06 DIAGNOSIS — R051 Acute cough: Secondary | ICD-10-CM | POA: Diagnosis not present

## 2023-10-06 LAB — POCT RAPID STREP A (OFFICE): Rapid Strep A Screen: NEGATIVE

## 2023-10-06 LAB — POC COVID19 BINAXNOW: SARS Coronavirus 2 Ag: NEGATIVE

## 2023-10-06 MED ORDER — PREDNISONE 10 MG (21) PO TBPK: ORAL_TABLET | ORAL | 0 refills | Status: DC

## 2023-10-06 MED ORDER — GUAIFENESIN-CODEINE 100-10 MG/5ML PO SYRP: 5.0000 mL | ORAL_SOLUTION | Freq: Three times a day (TID) | ORAL | 0 refills | Status: AC | PRN

## 2023-10-06 MED ORDER — CEFDINIR 300 MG PO CAPS: 300.0000 mg | ORAL_CAPSULE | Freq: Two times a day (BID) | ORAL | 0 refills | Status: AC

## 2023-10-06 NOTE — Progress Notes (Signed)
Established Patient Office Visit  Subjective:   Patient ID: Michele Burnett, female    DOB: 08-27-1991  Age: 32 y.o. MRN: [id number]  CC:  Chief Complaint  Patient presents with   Cough    Onset of symptoms was 4 days ago. Reports cough, sore throat, fever, body aches, shortness of breath and chest tightness. Was seen at urgent care but doesn't feel like she is improving. Has been taking Tylenol, Ibuprofen and Mucinex with no relief.    HPI: Michele Burnett is a 32 y.o. female presenting on 10/06/2023 for Cough (Onset of symptoms was 4 days ago. Reports cough, sore throat, fever, body aches, shortness of breath and chest tightness. Was seen at urgent care but doesn't feel like she is improving. Has been taking Tylenol, Ibuprofen and Mucinex with no relief.)  five days ago started with sore throat, fever, and body aches. She states cough is continuing to be more constant , chest congestion and sob. The cough is non-productive. Albuterol used yesterday but did not seem to help her. Last fever was this am temp was 100.3 she took ibuprofen and tylenol this am for fever 101.   Was seen at urgent care [facility] 11/3 , covid and flu test negative.   Otc use of tylenol ibuprofen and mucinex with no relief.      Wt Readings from Last 3 Encounters:  10/06/23 252 lb 3.2 oz (114.4 kg)  04/30/23 241 lb (109.3 kg)  03/10/23 242 lb 6 oz (109.9 kg)   Temp Readings from Last 3 Encounters:  10/06/23 100.3 F (37.9 C) (Oral)  10/04/23 99.6 F (37.6 C)  03/10/23 97.8 F (36.6 C) (Temporal)   BP Readings from Last 3 Encounters:  10/06/23 104/68  10/04/23 109/76  04/30/23 124/76   Pulse Readings from Last 3 Encounters:  10/06/23 (!) 102  10/04/23 (!) 115  04/30/23 86      ROS: Negative unless specifically indicated above in HPI.   Relevant past medical history reviewed and updated as indicated.   Allergies and medications reviewed and updated.   Current Outpatient  Medications:    albuterol (VENTOLIN HFA) 108 (90 Base) MCG/ACT inhaler, INHALE 2 PUFFS BY MOUTH EVERY 6 HOURS AS NEEDED FOR WHEEZE OR SHORTNESS OF BREATH, Disp: 8.5 each, Rfl: 0   cefdinir (OMNICEF) 300 MG capsule, Take 1 capsule (300 mg total) by mouth 2 (two) times daily for 7 days., Disp: 14 capsule, Rfl: 0   escitalopram (LEXAPRO) 10 MG tablet, Take 1 tablet (10 mg total) by mouth daily., Disp: 90 tablet, Rfl: 3   fluticasone (FLONASE) 50 MCG/ACT nasal spray, Place 2 sprays into both nostrils daily., Disp: 16 g, Rfl: 6   levonorgestrel (MIRENA) 20 MCG/24HR IUD, 1 each by Intrauterine route once., Disp: , Rfl:    predniSONE (STERAPRED UNI-PAK 21 TAB) 10 MG (21) TBPK tablet, Take as directed, Disp: 1 each, Rfl: 0  Allergies  Allergen Reactions   Amoxicillin     Other reaction(s): Vomiting    Objective:   BP 104/68 (BP Location: Left Arm, Patient Position: Sitting, Cuff Size: Normal)   Pulse (!) 102   Temp 100.3 F (37.9 C) (Oral)   Ht 5\' 4"  (1.626 m)   Wt 252 lb 3.2 oz (114.4 kg)   SpO2 94%   BMI 43.29 kg/m    Physical Exam Constitutional:      General: She is not in acute distress.    Appearance: Normal appearance. She is normal weight.  She is not ill-appearing, toxic-appearing or diaphoretic.  HENT:     Head: Normocephalic.     Right Ear: Tympanic membrane normal.     Left Ear: Tympanic membrane normal.     Nose: Nose normal.     Mouth/Throat:     Mouth: Mucous membranes are dry.     Pharynx: No oropharyngeal exudate or posterior oropharyngeal erythema.  Eyes:     Extraocular Movements: Extraocular movements intact.     Pupils: Pupils are equal, round, and reactive to light.  Cardiovascular:     Rate and Rhythm: Normal rate and regular rhythm.     Pulses: Normal pulses.     Heart sounds: Normal heart sounds.  Pulmonary:     Effort: Pulmonary effort is normal.     Breath sounds: Normal breath sounds.  Musculoskeletal:     Cervical back: Normal range of motion.   Neurological:     General: No focal deficit present.     Mental Status: She is alert and oriented to person, place, and time. Mental status is at baseline.  Psychiatric:        Mood and Affect: Mood normal.        Behavior: Behavior normal.        Thought Content: Thought content normal.        Judgment: Judgment normal.     Assessment & Plan:  Acute cough -     POC COVID-19 BinaxNow -     predniSONE; Take as directed  Dispense: 1 each; Refill: 0 -     guaiFENesin-Codeine; Take 5 mLs by mouth 3 (three) times daily as needed for up to 5 days for cough.  Dispense: 75 mL; Refill: 0  Sore throat -     POCT rapid strep A  Right acute otitis media Assessment & Plan: Rx cefdinir Cough meds given, precautions discussed. Pdmp reviewed. Take antibiotic as prescribed. Increase oral fluids. Pt to f/u if sx worsen and or fail to improve in 2-3 days.   Orders: -     predniSONE; Take as directed  Dispense: 1 each; Refill: 0 -     Cefdinir; Take 1 capsule (300 mg total) by mouth 2 (two) times daily for 7 days.  Dispense: 14 capsule; Refill: 0     Follow up plan: Return if symptoms worsen or fail to improve.  Mort Sawyers, FNP

## 2023-10-07 ENCOUNTER — Ambulatory Visit: Payer: BC Managed Care – PPO | Admitting: Family

## 2023-10-12 DIAGNOSIS — H6691 Otitis media, unspecified, right ear: Secondary | ICD-10-CM | POA: Insufficient documentation

## 2023-10-12 NOTE — Assessment & Plan Note (Signed)
Rx cefdinir Cough meds given, precautions discussed. Pdmp reviewed. Take antibiotic as prescribed. Increase oral fluids. Pt to f/u if sx worsen and or fail to improve in 2-3 days.

## 2023-10-19 DIAGNOSIS — S93411A Sprain of calcaneofibular ligament of right ankle, initial encounter: Secondary | ICD-10-CM | POA: Diagnosis not present

## 2023-11-20 ENCOUNTER — Other Ambulatory Visit: Payer: Self-pay | Admitting: Family

## 2023-11-20 DIAGNOSIS — F411 Generalized anxiety disorder: Secondary | ICD-10-CM

## 2023-11-26 ENCOUNTER — Telehealth: Payer: Self-pay | Admitting: Adult Health

## 2023-11-26 NOTE — Telephone Encounter (Signed)
Patient was seen in May of 2023 and was to have a sleep study. Patient forgot about it and was wondering if she needed a new appointment or if they sleep study could be preformed. Please call and advise. [phone]

## 2023-12-04 NOTE — Telephone Encounter (Signed)
 Appt made. NFN

## 2023-12-04 NOTE — Telephone Encounter (Signed)
 Pt Needs an appt, please call and schedule.

## 2023-12-24 ENCOUNTER — Other Ambulatory Visit: Payer: Self-pay | Admitting: Nurse Practitioner

## 2023-12-24 DIAGNOSIS — Z30433 Encounter for removal and reinsertion of intrauterine contraceptive device: Secondary | ICD-10-CM

## 2024-01-11 ENCOUNTER — Other Ambulatory Visit: Payer: Self-pay | Admitting: *Deleted

## 2024-01-11 DIAGNOSIS — R0683 Snoring: Secondary | ICD-10-CM

## 2024-01-12 ENCOUNTER — Ambulatory Visit (INDEPENDENT_AMBULATORY_CARE_PROVIDER_SITE_OTHER): Payer: 59 | Admitting: Adult Health

## 2024-01-12 ENCOUNTER — Encounter: Payer: Self-pay | Admitting: Adult Health

## 2024-01-12 VITALS — BP 111/78 | HR 78 | Temp 97.3°F | Ht 63.0 in | Wt 256.4 lb

## 2024-01-12 DIAGNOSIS — R0683 Snoring: Secondary | ICD-10-CM

## 2024-01-12 NOTE — Patient Instructions (Signed)
Set up for home sleep study  Healthy sleep regimen  Do not drive if sleepy.  Follow up in 4-6 weeks to discuss results .

## 2024-01-12 NOTE — Progress Notes (Unsigned)
@Patient  ID: Michele Burnett, female    DOB: 05/07/1991, 33 y.o.   MRN: [id number]  No chief complaint on file.   Referring provider: Mort Sawyers, FNP  HPI: 33 yo female seen for sleep consult 04/21/22 for sleep consult for snoring   TEST/EVENTS :   01/12/2024   Snoring   Tired      Allergies  Allergen Reactions   Amoxicillin     Other reaction(s): Vomiting    Immunization History  Administered Date(s) Administered   HPV Quadrivalent 01/04/2008   Influenza,inj,Quad PF,6+ Mos 08/31/2020, 09/14/2021   Influenza-Unspecified 08/22/2015, 09/01/2019, 09/30/2023   PFIZER Comirnaty(Gray Top)Covid-19 Tri-Sucrose Vaccine 07/29/2020, 08/19/2020   PPD Test 08/24/2018   Tdap 02/08/2013    Past Medical History:  Diagnosis Date   Fracture 7-12   RIGHT FOOT   History of chickenpox    History of shingles    Ovarian cyst rupture 12-26-11   Sinus infection 012513   ALSO DIAGNOSED TONSILLITIS   Tonsil stone     Tobacco History: Social History   Tobacco Use  Smoking Status Never  Smokeless Tobacco Never   Counseling given: Not Answered   Outpatient Medications Prior to Visit  Medication Sig Dispense Refill   albuterol (VENTOLIN HFA) 108 (90 Base) MCG/ACT inhaler INHALE 2 PUFFS BY MOUTH EVERY 6 HOURS AS NEEDED FOR WHEEZE OR SHORTNESS OF BREATH 8.5 each 0   escitalopram (LEXAPRO) 10 MG tablet TAKE 1 TABLET BY MOUTH EVERY DAY 90 tablet 0   fluticasone (FLONASE) 50 MCG/ACT nasal spray Place 2 sprays into both nostrils daily. 16 g 6   levonorgestrel (MIRENA) 20 MCG/24HR IUD 1 each by Intrauterine route once.     predniSONE (STERAPRED UNI-PAK 21 TAB) 10 MG (21) TBPK tablet Take as directed (Patient not taking: Reported on 01/12/2024) 1 each 0   No facility-administered medications prior to visit.     Review of Systems:   Constitutional:   No  weight loss, night sweats,  Fevers, chills, fatigue, or  lassitude.  HEENT:   No headaches,  Difficulty swallowing,  Tooth/dental  problems, or  Sore throat,                No sneezing, itching, ear ache, nasal congestion, post nasal drip,   CV:  No chest pain,  Orthopnea, PND, swelling in lower extremities, anasarca, dizziness, palpitations, syncope.   GI  No heartburn, indigestion, abdominal pain, nausea, vomiting, diarrhea, change in bowel habits, loss of appetite, bloody stools.   Resp: No shortness of breath with exertion or at rest.  No excess mucus, no productive cough,  No non-productive cough,  No coughing up of blood.  No change in color of mucus.  No wheezing.  No chest wall deformity  Skin: no rash or lesions.  GU: no dysuria, change in color of urine, no urgency or frequency.  No flank pain, no hematuria   MS:  No joint pain or swelling.  No decreased range of motion.  No back pain.    Physical Exam  There were no vitals taken for this visit.  GEN: A/Ox3; pleasant , NAD, well nourished    HEENT:  [place]/AT,  EACs-clear, TMs-wnl, NOSE-clear, THROAT-clear, no lesions, no postnasal drip or exudate noted.   NECK:  Supple w/ fair ROM; no JVD; normal carotid impulses w/o bruits; no thyromegaly or nodules palpated; no lymphadenopathy.    RESP  Clear  P & A; w/o, wheezes/ rales/ or rhonchi. no accessory muscle use, no dullness to  percussion  CARD:  RRR, no m/r/g, no peripheral edema, pulses intact, no cyanosis or clubbing.  GI:   Soft & nt; nml bowel sounds; no organomegaly or masses detected.   Musco: Warm bil, no deformities or joint swelling noted.   Neuro: alert, no focal deficits noted.    Skin: Warm, no lesions or rashes    Lab Results:  CBC    Component Value Date/Time   WBC 10.5 08/25/2022 1543   RBC 4.46 08/25/2022 1543   HGB 13.1 08/25/2022 1543   HCT 38.6 08/25/2022 1543   PLT 325.0 08/25/2022 1543   MCV 86.6 08/25/2022 1543   MCH 28.5 04/03/2020 1612   MCHC 33.9 08/25/2022 1543   RDW 12.6 08/25/2022 1543   LYMPHSABS 3.2 08/25/2022 1543   MONOABS 0.5 08/25/2022 1543   EOSABS  0.2 08/25/2022 1543   BASOSABS 0.1 08/25/2022 1543    BMET    Component Value Date/Time   NA 138 03/24/2022 0814   NA 142 12/26/2011 1243   K 4.0 03/24/2022 0814   K 3.7 12/26/2011 1243   CL 104 03/24/2022 0814   CL 104 12/26/2011 1243   CO2 26 03/24/2022 0814   CO2 28 12/26/2011 1243   GLUCOSE 101 (H) 03/24/2022 0814   GLUCOSE 91 12/26/2011 1243   BUN 11 03/24/2022 0814   BUN 7 12/26/2011 1243   CREATININE 0.59 03/24/2022 0814   CREATININE 0.65 04/03/2020 1612   CALCIUM 8.6 03/24/2022 0814   CALCIUM 9.1 12/26/2011 1243   GFRNONAA >60 12/26/2011 1243   GFRAA >60 12/26/2011 1243    BNP No results found for: "BNP"  ProBNP No results found for: "PROBNP"  Imaging: No results found.  Administration History     None           No data to display          No results found for: "NITRICOXIDE"      Assessment & Plan:   No problem-specific Assessment & Plan notes found for this encounter.     Rubye Oaks, NP 01/12/2024

## 2024-01-13 NOTE — Assessment & Plan Note (Signed)
Snoring, restless sleep, daytime sleepiness, BMI 45 suspicion for underlying sleep apnea.  Recommend proceeding with a home sleep study.  Unfortunately did not get ordered correctly at last visit.  Will proceed today with a home sleep study.  Order has been placed and verified  Plan  Patient Instructions  Set up for home sleep study  Healthy sleep regimen  Do not drive if sleepy.  Follow up in 4-6 weeks to discuss results .

## 2024-01-14 ENCOUNTER — Ambulatory Visit: Payer: 59

## 2024-01-14 DIAGNOSIS — R0683 Snoring: Secondary | ICD-10-CM

## 2024-01-24 NOTE — Telephone Encounter (Signed)
 Pt sent mychart message checking to see if results of sleep study have come back. Pt states she completed this 2/13. Tammy, please advise.

## 2024-02-03 ENCOUNTER — Telehealth: Payer: Self-pay | Admitting: Pulmonary Disease

## 2024-02-03 DIAGNOSIS — G4733 Obstructive sleep apnea (adult) (pediatric): Secondary | ICD-10-CM | POA: Diagnosis not present

## 2024-02-03 NOTE — Telephone Encounter (Signed)
 Call patient  Sleep study result  Date of study: 01/14/2024  Impression: Moderate obstructive sleep apnea with moderate oxygen desaturations  Recommendation: DME referral  Recommend CPAP therapy for moderate obstructive sleep apnea  Auto titrating CPAP with pressure settings of 5-20 will be appropriate  Encourage weight loss measures  Follow-up in the office 4 to 6 weeks following initiation of treatment

## 2024-02-08 NOTE — Telephone Encounter (Signed)
 Talked to [place] regarding sleep study results. Pt verbalized understanding and is wanting to start on cpap. Order for cpap has been placed. NFN.

## 2024-02-19 ENCOUNTER — Encounter: Payer: Self-pay | Admitting: Adult Health

## 2024-02-19 ENCOUNTER — Ambulatory Visit: Payer: 59 | Admitting: Adult Health

## 2024-02-19 VITALS — BP 114/74 | HR 74 | Ht 64.0 in | Wt 257.0 lb

## 2024-02-19 DIAGNOSIS — G4733 Obstructive sleep apnea (adult) (pediatric): Secondary | ICD-10-CM

## 2024-02-19 DIAGNOSIS — Z6841 Body Mass Index (BMI) 40.0 and over, adult: Secondary | ICD-10-CM

## 2024-02-19 NOTE — Assessment & Plan Note (Signed)
 Moderate obstructive sleep apnea.  Patient education given on sleep apnea and treatment options.  Patient would like to proceed with CPAP therapy.  Begin auto CPAP 5 to 20 cm H2O.  - discussed how weight can impact sleep and risk for sleep disordered breathing - discussed options to assist with weight loss: combination of diet modification, cardiovascular and strength training exercises   - had an extensive discussion regarding the adverse health consequences related to untreated sleep disordered breathing - specifically discussed the risks for hypertension, coronary artery disease, cardiac dysrhythmias, cerebrovascular disease, and diabetes - lifestyle modification discussed   - discussed how sleep disruption can increase risk of accidents, particularly when driving - safe driving practices were discussed   Plan  Patient Instructions  Begin CPAP At bedtime, wear all night long.  Work on healthy weight loss  Do not drive if sleepy Follow up in 3-4 months and As needed

## 2024-02-19 NOTE — Assessment & Plan Note (Signed)
 Healthy weight loss discussed

## 2024-02-19 NOTE — Progress Notes (Signed)
 @Patient  ID: Michele Burnett, female    DOB: 21-Mar-1991, 33 y.o.   MRN: [id number]  Chief Complaint  Patient presents with   Follow-up    Referring provider: Mort Sawyers, FNP  HPI: 33 year old female seen for sleep consult May 2023 and daytime sleepiness.  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  Patient was seen at last visit for ongoing sleep issues with snoring, daytime sleepiness.  She was set up for home sleep study was completed on January 14, 2024 that showed moderate sleep apnea with an AHI of 23.4/hour and SpO2 low at 80%.  We went over her sleep study results in detail.  Discussed treatment options.  Patient would like to proceed with CPAP therapy.  Allergies  Allergen Reactions   Amoxicillin     Other reaction(s): Vomiting    Immunization History  Administered Date(s) Administered   HPV Quadrivalent 01/04/2008   Influenza,inj,Quad PF,6+ Mos 08/31/2020, 09/14/2021   Influenza-Unspecified 08/22/2015, 09/01/2019, 09/30/2023   PFIZER Comirnaty(Gray Top)Covid-19 Tri-Sucrose Vaccine 07/29/2020, 08/19/2020   PPD Test 08/24/2018   Tdap 02/08/2013    Past Medical History:  Diagnosis Date   Fracture 7-12   RIGHT FOOT   History of chickenpox    History of shingles    Ovarian cyst rupture 12-26-11   Sinus infection [id number]   ALSO DIAGNOSED TONSILLITIS   Tonsil stone     Tobacco History: Social History   Tobacco Use  Smoking Status Never  Smokeless Tobacco Never   Counseling given: Not Answered   Outpatient Medications Prior to Visit  Medication Sig Dispense Refill   albuterol (VENTOLIN HFA) 108 (90 Base) MCG/ACT inhaler INHALE 2 PUFFS BY MOUTH EVERY 6 HOURS AS NEEDED FOR WHEEZE OR SHORTNESS OF BREATH 8.5 each 0   escitalopram  (LEXAPRO) 10 MG tablet TAKE 1 TABLET BY MOUTH EVERY DAY 90 tablet 0   fluticasone (FLONASE) 50 MCG/ACT nasal spray Place 2 sprays into both nostrils daily. 16 g 6   levonorgestrel (MIRENA) 20 MCG/24HR IUD 1 each by Intrauterine route once.     No facility-administered medications prior to visit.     Review of Systems:   Constitutional:   No  weight loss, night sweats,  Fevers, chills, +fatigue, or  lassitude.  HEENT:   No headaches,  Difficulty swallowing,  Tooth/dental problems, or  Sore throat,                No sneezing, itching, ear ache, nasal congestion, post nasal drip,   CV:  No chest pain,  Orthopnea, PND, swelling in lower extremities, anasarca, dizziness, palpitations, syncope.   GI  No heartburn, indigestion, abdominal pain, nausea, vomiting, diarrhea, change in bowel habits, loss of appetite, bloody stools.   Resp: No shortness of breath with exertion or at rest.  No excess mucus, no productive cough,  No non-productive cough,  No coughing up of blood.  No change in color of mucus.  No wheezing.  No chest wall deformity  Skin: no rash or lesions.  GU: no dysuria, change in color of urine, no urgency or frequency.  No flank pain, no hematuria   MS:  No joint pain or swelling.  No decreased range of motion.  No back pain.    Physical Exam  BP 114/74 (BP Location:  Left Arm, Patient Position: Sitting, Cuff Size: Normal)   Pulse 74   Ht 5\' 4"  (1.626 m)   Wt 257 lb (116.6 kg)   SpO2 98%   BMI 44.11 kg/m   GEN: A/Ox3; pleasant , NAD, well nourished    HEENT:  [place]/AT,  NOSE-clear, THROAT-clear, no lesions, no postnasal drip or exudate noted.   NECK:  Supple w/ fair ROM; no JVD; normal carotid impulses w/o bruits; no thyromegaly or nodules palpated; no lymphadenopathy.    RESP  Clear  P & A; w/o, wheezes/ rales/ or rhonchi. no accessory muscle use, no dullness to percussion  CARD:  RRR, no m/r/g, no peripheral edema, pulses intact, no cyanosis or clubbing.  GI:    Soft & nt; nml bowel sounds; no organomegaly or masses detected.   Musco: Warm bil, no deformities or joint swelling noted.   Neuro: alert, no focal deficits noted.    Skin: Warm, no lesions or rashes    Lab Results:  CBC    Component Value Date/Time   WBC 10.5 08/25/2022 1543   RBC 4.46 08/25/2022 1543   HGB 13.1 08/25/2022 1543   HCT 38.6 08/25/2022 1543   PLT 325.0 08/25/2022 1543   MCV 86.6 08/25/2022 1543   MCH 28.5 04/03/2020 1612   MCHC 33.9 08/25/2022 1543   RDW 12.6 08/25/2022 1543   LYMPHSABS 3.2 08/25/2022 1543   MONOABS 0.5 08/25/2022 1543   EOSABS 0.2 08/25/2022 1543   BASOSABS 0.1 08/25/2022 1543    BMET    Component Value Date/Time   NA 138 03/24/2022 0814   NA 142 12/26/2011 1243   K 4.0 03/24/2022 0814   K 3.7 12/26/2011 1243   CL 104 03/24/2022 0814   CL 104 12/26/2011 1243   CO2 26 03/24/2022 0814   CO2 28 12/26/2011 1243   GLUCOSE 101 (H) 03/24/2022 0814   GLUCOSE 91 12/26/2011 1243   BUN 11 03/24/2022 0814   BUN 7 12/26/2011 1243   CREATININE 0.59 03/24/2022 0814   CREATININE 0.65 04/03/2020 1612   CALCIUM 8.6 03/24/2022 0814   CALCIUM 9.1 12/26/2011 1243   GFRNONAA >60 12/26/2011 1243   GFRAA >60 12/26/2011 1243    BNP No results found for: "BNP"  ProBNP No results found for: "PROBNP"  Imaging: No results found.  Administration History     None           No data to display          No results found for: "NITRICOXIDE"      Assessment & Plan:   OSA (obstructive sleep apnea) Moderate obstructive sleep apnea.  Patient education given on sleep apnea and treatment options.  Patient would like to proceed with CPAP therapy.  Begin auto CPAP 5 to 20 cm H2O.  - discussed how weight can impact sleep and risk for sleep disordered breathing - discussed options to assist with weight loss: combination of diet modification, cardiovascular and strength training exercises   - had an extensive discussion regarding the  adverse health consequences related to untreated sleep disordered breathing - specifically discussed the risks for hypertension, coronary artery disease, cardiac dysrhythmias, cerebrovascular disease, and diabetes - lifestyle modification discussed   - discussed how sleep disruption can increase risk of accidents, particularly when driving - safe driving practices were discussed   Plan  Patient Instructions  Begin CPAP At bedtime, wear all night long.  Work on healthy weight loss  Do not drive if sleepy Follow up in 3-4  months and As needed      BMI 40.0-44.9, adult (HCC) Healthy weight loss discussed      Rubye Oaks, NP 02/19/2024

## 2024-02-19 NOTE — Patient Instructions (Signed)
 Begin CPAP At bedtime, wear all night long.  Work on healthy weight loss  Do not drive if sleepy Follow up in 3-4 months and As needed

## 2024-02-24 ENCOUNTER — Ambulatory Visit (INDEPENDENT_AMBULATORY_CARE_PROVIDER_SITE_OTHER): Payer: 59 | Admitting: Nurse Practitioner

## 2024-02-24 ENCOUNTER — Encounter: Payer: Self-pay | Admitting: Nurse Practitioner

## 2024-02-24 VITALS — BP 122/76 | HR 81

## 2024-02-24 DIAGNOSIS — Z30433 Encounter for removal and reinsertion of intrauterine contraceptive device: Secondary | ICD-10-CM

## 2024-02-24 LAB — PREGNANCY, URINE: Preg Test, Ur: NEGATIVE

## 2024-02-24 MED ORDER — LEVONORGESTREL 20 MCG/DAY IU IUD: 1.0000 | INTRAUTERINE_SYSTEM | Freq: Once | INTRAUTERINE | Status: DC

## 2024-02-24 MED ORDER — LEVONORGESTREL 20 MCG/DAY IU IUD
1.0000 | INTRAUTERINE_SYSTEM | Freq: Once | INTRAUTERINE | Status: AC
  Administered 2024-02-24: 1 via INTRAUTERINE

## 2024-02-24 NOTE — Addendum Note (Signed)
 Addended by: Melrose Nakayama on: 02/24/2024 01:45 PM   Modules accepted: Orders

## 2024-02-24 NOTE — Progress Notes (Signed)
   Michele Burnett 1991-10-21 [id number]   History:  33 y.o. G0 presents for exchange of MIrena IUD.  Pt has been counseled about risks and benefits as well as complications.    No LMP recorded. (Menstrual status: IUD). STD testing: Declines  Past medical history, past surgical history, family history and social history were all reviewed and documented in the EPIC chart.  ROS:  A ROS was performed and pertinent positives and negatives are included.  Exam: Vitals:   02/24/24 1141  BP: 122/76  Pulse: 81  SpO2: 97%   There is no height or weight on file to calculate BMI.  Consent obtained and timeout performed.  Pelvic exam: Vulva:  normal female genitalia Vagina:  normal vagina, no discharge, exudate, lesion, or erythema Cervix:  Non-tender, Negative CMT, no lesions or redness. Uterus:  normal shape, position and consistency   Procedure:  Speculum inserted. Cervix visualized, IUD string not visualized, unable to tease out with cytobrush. Alligator forceps used to grasp device just within the cervix. IUD removed with ease. Cervix cleansed with Betadine x 3. Sterile gloves applied. Tenaculum placed on anterior cervix. Then uterus sounded to 9 cm. IUD inserted easily. Strings trimmed to 3 cm.  Minimal bleeding noted.  Pt tolerated the procedure well.  Assessment/Plan:  Insertion of Mirena IUD             UPT neg   Return for recheck 4-6 weeks Pt aware to call for any concerns Pt aware removal due no later than 8 years from insertion date, IUD card given to pt.   Michele Mackie DNP, 11:49 AM 02/24/2024

## 2024-02-26 ENCOUNTER — Other Ambulatory Visit: Payer: Self-pay | Admitting: Family

## 2024-02-26 DIAGNOSIS — F411 Generalized anxiety disorder: Secondary | ICD-10-CM

## 2024-02-29 NOTE — Telephone Encounter (Signed)
 Please schedule pt to CPE. She is overdue.

## 2024-02-29 NOTE — Telephone Encounter (Signed)
 Spoke to pt, scheduled cpe for 05/24/24

## 2024-03-28 ENCOUNTER — Ambulatory Visit: Admitting: Nurse Practitioner

## 2024-05-02 ENCOUNTER — Ambulatory Visit (INDEPENDENT_AMBULATORY_CARE_PROVIDER_SITE_OTHER): Payer: BC Managed Care – PPO | Admitting: Nurse Practitioner

## 2024-05-02 ENCOUNTER — Encounter: Payer: Self-pay | Admitting: Nurse Practitioner

## 2024-05-02 VITALS — BP 110/74 | HR 72 | Ht 63.5 in | Wt 229.0 lb

## 2024-05-02 DIAGNOSIS — Z30431 Encounter for routine checking of intrauterine contraceptive device: Secondary | ICD-10-CM | POA: Diagnosis not present

## 2024-05-02 DIAGNOSIS — Z1331 Encounter for screening for depression: Secondary | ICD-10-CM | POA: Diagnosis not present

## 2024-05-02 DIAGNOSIS — Z01419 Encounter for gynecological examination (general) (routine) without abnormal findings: Secondary | ICD-10-CM | POA: Diagnosis not present

## 2024-05-02 NOTE — Progress Notes (Signed)
   Michele Burnett Jul 25, 1991 [id number]   History:  33 y.o. G0 presents for annual exam. Mirena  IUD 01/2024. Normal pap history. Has received Gardasil series. Down 23 pounds with Optavia.   Gynecologic History No LMP recorded. (Menstrual status: IUD).   Contraception/Family planning: IUD Sexually active: Yes  Health Maintenance Last Pap: 04/30/2023. Results were: Normal neg HPV Last mammogram: Not indicated Last colonoscopy: No indicated Last Dexa: Not indicated     05/02/2024    2:51 PM  Depression screen PHQ 2/9  Decreased Interest 0  Down, Depressed, Hopeless 0  PHQ - 2 Score 0     Past medical history, past surgical history, family history and social history were all reviewed and documented in the EPIC chart. 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   ROS:  A ROS was performed and pertinent positives and negatives are included.  Exam:  Vitals:   05/02/24 1447  BP: 110/74  Pulse: 72  SpO2: 99%  Weight: 229 lb (103.9 kg)  Height: 5' 3.5" (1.613 m)     Body mass index is 39.93 kg/m.  General appearance:  Normal Thyroid:  Symmetrical, normal in size, without palpable masses or nodularity. Respiratory  Auscultation:  Clear without wheezing or rhonchi Cardiovascular  Auscultation:  Regular rate, without rubs, murmurs or gallops  Edema/varicosities:  Not grossly evident Abdominal  Soft,nontender, without masses, guarding or rebound.  Liver/spleen:  No organomegaly noted  Hernia:  None appreciated  Skin  Inspection:  Grossly normal Breasts: Examined lying and sitting.   Right: Without masses, retractions, nipple discharge or axillary adenopathy.   Left: Without masses, retractions, nipple discharge or axillary adenopathy. Pelvic: External genitalia:  no lesions              Urethra:  normal appearing urethra with no masses, tenderness or lesions              Bartholins and Skenes: normal                 Vagina: normal appearing vagina with normal color  and discharge, no lesions              Cervix: no lesions. + IUD strings Bimanual Exam:  Uterus:  no masses or tenderness              Adnexa: no mass, fullness, tenderness              Rectovaginal: Deferred              Anus:  normal, no lesions   Michele Garden, NP student present as chaperone.   Assessment/Plan:  33 y.o. G0 for annual exam.   Well female exam with routine gynecological exam - Education provided on SBEs, importance of preventative screenings, current guidelines, high calcium diet, regular exercise, and multivitamin daily. Labs with PCP.  Screening for cervical cancer - Normal pap history. Will repeat at 5-year interval per guidelines.   Encounter for routine checking of intrauterine contraceptive device (IUD) - Mirena  01/2024. Aware of 8-year FDA approval. No concerns.   Return in about 1 year (around 05/02/2025) for Annual.   Michele Bamberger DNP, 3:09 PM 05/02/2024

## 2024-05-24 ENCOUNTER — Encounter: Admitting: Family

## 2024-06-20 ENCOUNTER — Ambulatory Visit: Admitting: Adult Health

## 2024-06-23 ENCOUNTER — Encounter: Payer: Self-pay | Admitting: Family

## 2024-06-23 ENCOUNTER — Ambulatory Visit (INDEPENDENT_AMBULATORY_CARE_PROVIDER_SITE_OTHER): Payer: Self-pay | Admitting: Family

## 2024-06-23 VITALS — BP 114/70 | HR 64 | Temp 98.4°F | Ht 64.0 in | Wt 213.4 lb

## 2024-06-23 DIAGNOSIS — Z0000 Encounter for general adult medical examination without abnormal findings: Secondary | ICD-10-CM | POA: Insufficient documentation

## 2024-06-23 DIAGNOSIS — F411 Generalized anxiety disorder: Secondary | ICD-10-CM

## 2024-06-23 DIAGNOSIS — Z23 Encounter for immunization: Secondary | ICD-10-CM

## 2024-06-23 DIAGNOSIS — Z6836 Body mass index (BMI) 36.0-36.9, adult: Secondary | ICD-10-CM | POA: Diagnosis not present

## 2024-06-23 DIAGNOSIS — G4733 Obstructive sleep apnea (adult) (pediatric): Secondary | ICD-10-CM

## 2024-06-23 DIAGNOSIS — E7800 Pure hypercholesterolemia, unspecified: Secondary | ICD-10-CM | POA: Diagnosis not present

## 2024-06-23 LAB — CBC
HCT: 40 % (ref 36.0–46.0)
Hemoglobin: 13.5 g/dL (ref 12.0–15.0)
MCHC: 33.8 g/dL (ref 30.0–36.0)
MCV: 86.7 fl (ref 78.0–100.0)
Platelets: 266 K/uL (ref 150.0–400.0)
RBC: 4.62 Mil/uL (ref 3.87–5.11)
RDW: 12.6 % (ref 11.5–15.5)
WBC: 8.7 K/uL (ref 4.0–10.5)

## 2024-06-23 LAB — BASIC METABOLIC PANEL WITH GFR
BUN: 15 mg/dL (ref 6–23)
CO2: 28 meq/L (ref 19–32)
Calcium: 9.4 mg/dL (ref 8.4–10.5)
Chloride: 102 meq/L (ref 96–112)
Creatinine, Ser: 0.62 mg/dL (ref 0.40–1.20)
GFR: 118.14 mL/min (ref >60.00)
Glucose, Bld: 84 mg/dL (ref 70–99)
Potassium: 4.2 meq/L (ref 3.5–5.1)
Sodium: 137 meq/L (ref 135–145)

## 2024-06-23 LAB — TSH: TSH: 1.93 u[IU]/mL (ref 0.35–5.50)

## 2024-06-23 LAB — LIPID PANEL
Cholesterol: 152 mg/dL (ref 0–200)
HDL: 38.5 mg/dL — ABNORMAL LOW (ref >39.00)
LDL Cholesterol: 95 mg/dL (ref 0–99)
NonHDL: 113.77
Total CHOL/HDL Ratio: 4
Triglycerides: 93 mg/dL (ref 0.0–149.0)
VLDL: 18.6 mg/dL (ref 0.0–40.0)

## 2024-06-23 NOTE — Progress Notes (Signed)
 Subjective:  Patient ID: Michele Burnett, female    DOB: Nov 30, 1991  Age: 33 y.o. MRN: [id number]  Patient Care Team: Corwin Antu, FNP as PCP - General (Family Medicine) Prentiss Annabella LABOR, NP as Nurse Practitioner (Gynecology)   CC:  Chief Complaint  Patient presents with   Annual Exam    HPI Michele Burnett is a 33 y.o. female who presents today for an annual physical exam. She reports consuming a octavia diet. Two days a week exercises between pilates, yoga and or cardio/HIIT other days focuses on increasing her steps She generally feels well. She reports sleeping well. She does not have additional problems to discuss today.   Vision:Within last year Dental:Receives regular dental care  Last pap: 2024 normal  Pt is without acute concerns.   OSA has not been using CPAP but with the weight loss she states that there is a huge difference with her snoring, her husband states much better. Was moderate OSA has not retested since. She has increased energy throughout the day with the weight loss.   Wt Readings from Last 3 Encounters:  06/23/24 213 lb 6.4 oz (96.8 kg)  05/02/24 229 lb (103.9 kg)  02/19/24 257 lb (116.6 kg)     Advanced Directives Patient does not have advanced directives  DEPRESSION SCREENING    06/23/2024   12:04 PM 05/02/2024    2:51 PM 03/10/2023    2:38 PM 12/17/2022    7:43 AM 08/25/2022    3:18 PM 11/14/2021   12:18 PM 09/26/2021    9:26 AM  PHQ 2/9 Scores  PHQ - 2 Score 0 0 0 0 0 0 0  PHQ- 9 Score 0  1 1 1 8       ROS: Negative unless specifically indicated above in HPI.    Current Outpatient Medications:    albuterol  (VENTOLIN  HFA) 108 (90 Base) MCG/ACT inhaler, INHALE 2 PUFFS BY MOUTH EVERY 6 HOURS AS NEEDED FOR WHEEZE OR SHORTNESS OF BREATH, Disp: 8.5 each, Rfl: 0   cetirizine (ZYRTEC) 10 MG chewable tablet, Chew 10 mg by mouth daily., Disp: , Rfl:    escitalopram  (LEXAPRO ) 10 MG tablet, TAKE 1 TABLET BY MOUTH EVERY DAY, Disp: 90 tablet, Rfl: 1    fluticasone  (FLONASE ) 50 MCG/ACT nasal spray, Place 2 sprays into both nostrils daily. (Patient taking differently: Place 2 sprays into both nostrils daily as needed for allergies.), Disp: 16 g, Rfl: 6   levonorgestrel  (MIRENA ) 20 MCG/DAY IUD, 1 each by Intrauterine route once., Disp: , Rfl:     Objective:    BP 114/70 (BP Location: Left Arm, Patient Position: Sitting, Cuff Size: Large)   Pulse 64   Temp 98.4 F (36.9 C) (Temporal)   Ht 5' 4 (1.626 m)   Wt 213 lb 6.4 oz (96.8 kg)   SpO2 98%   BMI 36.63 kg/m   BP Readings from Last 3 Encounters:  06/23/24 114/70  05/02/24 110/74  02/24/24 122/76     Physical Exam Vitals reviewed.  Constitutional:      General: She is not in acute distress.    Appearance: Normal appearance. She is obese. She is not ill-appearing.  HENT:     Head: Normocephalic.     Right Ear: Tympanic membrane normal.     Left Ear: Tympanic membrane normal.     Nose: Nose normal.     Mouth/Throat:     Mouth: Mucous membranes are moist.  Eyes:     Extraocular Movements: Extraocular movements  intact.     Pupils: Pupils are equal, round, and reactive to light.  Cardiovascular:     Rate and Rhythm: Normal rate and regular rhythm.  Pulmonary:     Effort: Pulmonary effort is normal.     Breath sounds: Normal breath sounds.  Abdominal:     General: Abdomen is flat. Bowel sounds are normal.     Palpations: Abdomen is soft.     Tenderness: There is no guarding or rebound.  Musculoskeletal:        General: Normal range of motion.     Cervical back: Normal range of motion.  Skin:    General: Skin is warm.     Capillary Refill: Capillary refill takes less than 2 seconds.  Neurological:     General: No focal deficit present.     Mental Status: She is alert.  Psychiatric:        Mood and Affect: Mood normal.        Behavior: Behavior normal.        Thought Content: Thought content normal.        Judgment: Judgment normal.          Assessment &  Plan:  Adult BMI 36.0-36.9 kg/sq m Assessment & Plan: Pt advised to work on diet and exercise as tolerated    Elevated LDL cholesterol level -     Lipid panel  Encounter for general adult medical examination without abnormal findings Assessment & Plan: Patient Counseling(The following topics were reviewed):  Preventative care handout given to pt  Health maintenance and immunizations reviewed. Please refer to Health maintenance section. Pt advised on safe sex, wearing seatbelts in car, and proper nutrition labwork ordered today for annual Dental health: Discussed importance of regular tooth brushing, flossing, and dental visits.   Orders: -     Lipid panel -     Basic metabolic panel with GFR -     CBC -     TSH  Generalized anxiety disorder Assessment & Plan: Stable  Continue lexapro  10 mg once daily    OSA (obstructive sleep apnea) Assessment & Plan: Discussed risks of not following with CPAP  Pt continuing to work on diet and weight loss and is doing well        Follow-up: Return in about 1 year (around 06/23/2025) for f/u CPE.   Ginger Patrick, FNP

## 2024-06-23 NOTE — Assessment & Plan Note (Signed)
 Pt advised to work on diet and exercise as tolerated

## 2024-06-23 NOTE — Assessment & Plan Note (Signed)
Stable Continue lexapro 10 mg once daily  

## 2024-06-23 NOTE — Assessment & Plan Note (Signed)

## 2024-06-23 NOTE — Assessment & Plan Note (Signed)
 Discussed risks of not following with CPAP  Pt continuing to work on diet and weight loss and is doing well

## 2024-06-24 ENCOUNTER — Ambulatory Visit: Payer: Self-pay | Admitting: Family

## 2024-08-31 ENCOUNTER — Other Ambulatory Visit: Payer: Self-pay | Admitting: Family

## 2024-08-31 DIAGNOSIS — F411 Generalized anxiety disorder: Secondary | ICD-10-CM

## 2024-09-29 ENCOUNTER — Encounter: Payer: Self-pay | Admitting: Family

## 2024-09-30 MED ORDER — ESCITALOPRAM OXALATE 20 MG PO TABS: 20.0000 mg | ORAL_TABLET | Freq: Every day | ORAL | 0 refills | Status: DC

## 2024-12-24 ENCOUNTER — Other Ambulatory Visit: Payer: Self-pay | Admitting: Family
